# Patient Record
Sex: Female | Born: 1935 | Race: White | Hispanic: No | Smoking: Never smoker
Health system: Southern US, Community
[De-identification: ages and names within clinical notes are randomized; demographics above are authoritative.]

## PROBLEM LIST (undated history)

## (undated) DIAGNOSIS — K219 Gastro-esophageal reflux disease without esophagitis: Secondary | ICD-10-CM

## (undated) DIAGNOSIS — E079 Disorder of thyroid, unspecified: Secondary | ICD-10-CM

## (undated) DIAGNOSIS — E039 Hypothyroidism, unspecified: Secondary | ICD-10-CM

## (undated) DIAGNOSIS — M5136 Other intervertebral disc degeneration, lumbar region: Secondary | ICD-10-CM

## (undated) DIAGNOSIS — K5909 Other constipation: Secondary | ICD-10-CM

## (undated) DIAGNOSIS — I10 Essential (primary) hypertension: Secondary | ICD-10-CM

## (undated) DIAGNOSIS — E7800 Pure hypercholesterolemia, unspecified: Secondary | ICD-10-CM

## (undated) DIAGNOSIS — M85862 Other specified disorders of bone density and structure, left lower leg: Secondary | ICD-10-CM

## (undated) DIAGNOSIS — I700 Atherosclerosis of aorta: Secondary | ICD-10-CM

## (undated) DIAGNOSIS — R7302 Impaired glucose tolerance (oral): Secondary | ICD-10-CM

## (undated) HISTORY — DX: Other intervertebral disc degeneration, lumbar region: M51.36

## (undated) HISTORY — DX: Hypothyroidism, unspecified: E03.9

## (undated) HISTORY — DX: Impaired glucose tolerance (oral): R73.02

## (undated) HISTORY — DX: Pure hypercholesterolemia, unspecified: E78.00

## (undated) HISTORY — DX: Other specified disorders of bone density and structure, left lower leg: M85.862

## (undated) HISTORY — DX: Other constipation: K59.09

## (undated) HISTORY — DX: Gastro-esophageal reflux disease without esophagitis: K21.9

## (undated) HISTORY — DX: Atherosclerosis of aorta: I70.0

## (undated) HISTORY — PX: ABDOMINAL HYSTERECTOMY: SHX81

---

## 2007-05-23 ENCOUNTER — Ambulatory Visit: Payer: Self-pay | Admitting: Ophthalmology

## 2007-05-23 ENCOUNTER — Other Ambulatory Visit: Payer: Self-pay

## 2007-06-02 ENCOUNTER — Ambulatory Visit: Payer: Self-pay | Admitting: Ophthalmology

## 2007-07-14 ENCOUNTER — Ambulatory Visit: Payer: Self-pay | Admitting: Ophthalmology

## 2007-08-12 ENCOUNTER — Ambulatory Visit: Payer: Self-pay | Admitting: Ophthalmology

## 2013-04-15 ENCOUNTER — Emergency Department: Payer: Self-pay | Admitting: Emergency Medicine

## 2013-04-15 LAB — COMPREHENSIVE METABOLIC PANEL
BUN: 18 mg/dL (ref 7–18)
Calcium, Total: 9.8 mg/dL (ref 8.5–10.1)
Co2: 29 mmol/L (ref 21–32)
EGFR (African American): >60
EGFR (Non-African Amer.): 55 — ABNORMAL LOW
Glucose: 127 mg/dL — ABNORMAL HIGH (ref 65–99)
Osmolality: 281 (ref 275–301)
Potassium: 3.9 mmol/L (ref 3.5–5.1)
Sodium: 139 mmol/L (ref 136–145)
Total Protein: 7.9 g/dL (ref 6.4–8.2)

## 2013-04-15 LAB — URINALYSIS, COMPLETE
Bilirubin,UR: NEGATIVE
Nitrite: NEGATIVE
Ph: 7 (ref 4.5–8.0)
Protein: NEGATIVE
Specific Gravity: 1.013 (ref 1.003–1.030)
Squamous Epithelial: <1

## 2013-04-15 LAB — CBC
HGB: 12.8 g/dL (ref 12.0–16.0)
MCH: 31.2 pg (ref 26.0–34.0)
MCV: 92 fL (ref 80–100)
Platelet: 233 10*3/uL (ref 150–440)
RBC: 4.1 10*6/uL (ref 3.80–5.20)
RDW: 13.7 % (ref 11.5–14.5)

## 2013-04-15 LAB — CK TOTAL AND CKMB (NOT AT ARMC)
CK, Total: 70 U/L (ref 21–215)
CK-MB: 0.6 ng/mL (ref 0.5–3.6)

## 2013-04-15 LAB — LIPASE, BLOOD: Lipase: 431 U/L — ABNORMAL HIGH (ref 73–393)

## 2013-04-15 LAB — TROPONIN I: Troponin-I: <0.02 ng/mL

## 2013-04-17 LAB — URINE CULTURE

## 2013-06-30 ENCOUNTER — Ambulatory Visit: Payer: Self-pay | Admitting: Gastroenterology

## 2014-08-02 DIAGNOSIS — E039 Hypothyroidism, unspecified: Secondary | ICD-10-CM

## 2014-08-02 DIAGNOSIS — I10 Essential (primary) hypertension: Secondary | ICD-10-CM | POA: Insufficient documentation

## 2014-08-02 HISTORY — DX: Hypothyroidism, unspecified: E03.9

## 2015-02-03 DIAGNOSIS — E7800 Pure hypercholesterolemia, unspecified: Secondary | ICD-10-CM

## 2015-02-03 HISTORY — DX: Pure hypercholesterolemia, unspecified: E78.00

## 2015-03-07 ENCOUNTER — Other Ambulatory Visit: Payer: Self-pay | Admitting: Gastroenterology

## 2015-03-07 DIAGNOSIS — R109 Unspecified abdominal pain: Principal | ICD-10-CM

## 2015-03-07 DIAGNOSIS — G8929 Other chronic pain: Secondary | ICD-10-CM

## 2015-03-16 ENCOUNTER — Ambulatory Visit
Admission: RE | Admit: 2015-03-16 | Discharge: 2015-03-16 | Disposition: A | Discharge status: Home / Self Care | Payer: Medicare PPO | Source: Ambulatory Visit | Attending: Gastroenterology | Admitting: Gastroenterology

## 2015-03-16 DIAGNOSIS — R109 Unspecified abdominal pain: Secondary | ICD-10-CM | POA: Diagnosis not present

## 2015-03-16 DIAGNOSIS — G8929 Other chronic pain: Secondary | ICD-10-CM | POA: Insufficient documentation

## 2015-03-16 HISTORY — DX: Essential (primary) hypertension: I10

## 2015-03-16 MED ORDER — IOHEXOL 300 MG/ML  SOLN
100.0000 mL | Freq: Once | INTRAMUSCULAR | Status: AC | PRN
  Administered 2015-03-16: 100 mL via INTRAVENOUS

## 2015-06-23 ENCOUNTER — Other Ambulatory Visit: Payer: Self-pay | Admitting: Internal Medicine

## 2015-06-23 DIAGNOSIS — R1084 Generalized abdominal pain: Secondary | ICD-10-CM

## 2015-06-23 DIAGNOSIS — D72829 Elevated white blood cell count, unspecified: Secondary | ICD-10-CM

## 2015-06-24 ENCOUNTER — Ambulatory Visit
Admission: RE | Admit: 2015-06-24 | Discharge: 2015-06-24 | Disposition: A | Discharge status: Home / Self Care | Payer: Medicare PPO | Source: Ambulatory Visit | Attending: Internal Medicine | Admitting: Internal Medicine

## 2015-06-24 ENCOUNTER — Other Ambulatory Visit
Admission: RE | Admit: 2015-06-24 | Discharge: 2015-06-24 | Disposition: A | Discharge status: Home / Self Care | Payer: Medicare PPO | Source: Ambulatory Visit | Attending: Internal Medicine | Admitting: Internal Medicine

## 2015-06-24 DIAGNOSIS — R1084 Generalized abdominal pain: Secondary | ICD-10-CM | POA: Diagnosis present

## 2015-06-24 DIAGNOSIS — M5136 Other intervertebral disc degeneration, lumbar region: Secondary | ICD-10-CM | POA: Insufficient documentation

## 2015-06-24 DIAGNOSIS — Z01812 Encounter for preprocedural laboratory examination: Secondary | ICD-10-CM | POA: Diagnosis present

## 2015-06-24 DIAGNOSIS — I700 Atherosclerosis of aorta: Secondary | ICD-10-CM | POA: Diagnosis not present

## 2015-06-24 DIAGNOSIS — K859 Acute pancreatitis, unspecified: Secondary | ICD-10-CM | POA: Diagnosis not present

## 2015-06-24 DIAGNOSIS — D72829 Elevated white blood cell count, unspecified: Secondary | ICD-10-CM

## 2015-06-24 DIAGNOSIS — K5730 Diverticulosis of large intestine without perforation or abscess without bleeding: Secondary | ICD-10-CM | POA: Diagnosis not present

## 2015-06-24 LAB — CREATININE, SERUM
Creatinine, Ser: 0.83 mg/dL (ref 0.44–1.00)
GFR calc non Af Amer: >60 mL/min (ref >60)

## 2015-06-24 MED ORDER — IOHEXOL 300 MG/ML  SOLN
100.0000 mL | Freq: Once | INTRAMUSCULAR | Status: AC | PRN
  Administered 2015-06-24: 100 mL via INTRAVENOUS

## 2015-06-27 ENCOUNTER — Other Ambulatory Visit: Payer: Self-pay | Admitting: Internal Medicine

## 2015-06-27 DIAGNOSIS — R1084 Generalized abdominal pain: Secondary | ICD-10-CM

## 2015-08-08 DIAGNOSIS — Z8719 Personal history of other diseases of the digestive system: Secondary | ICD-10-CM | POA: Insufficient documentation

## 2015-08-09 ENCOUNTER — Emergency Department
Admission: EM | Admit: 2015-08-09 | Discharge: 2015-08-09 | Disposition: A | Discharge status: Home / Self Care | Payer: Medicare PPO | Attending: Emergency Medicine | Admitting: Emergency Medicine

## 2015-08-09 ENCOUNTER — Emergency Department: Payer: Medicare PPO

## 2015-08-09 ENCOUNTER — Encounter: Payer: Self-pay | Admitting: *Deleted

## 2015-08-09 DIAGNOSIS — S0990XA Unspecified injury of head, initial encounter: Secondary | ICD-10-CM | POA: Diagnosis not present

## 2015-08-09 DIAGNOSIS — Y9289 Other specified places as the place of occurrence of the external cause: Secondary | ICD-10-CM | POA: Insufficient documentation

## 2015-08-09 DIAGNOSIS — S0081XA Abrasion of other part of head, initial encounter: Secondary | ICD-10-CM | POA: Diagnosis not present

## 2015-08-09 DIAGNOSIS — S92301A Fracture of unspecified metatarsal bone(s), right foot, initial encounter for closed fracture: Secondary | ICD-10-CM

## 2015-08-09 DIAGNOSIS — W01198A Fall on same level from slipping, tripping and stumbling with subsequent striking against other object, initial encounter: Secondary | ICD-10-CM | POA: Diagnosis not present

## 2015-08-09 DIAGNOSIS — W19XXXA Unspecified fall, initial encounter: Secondary | ICD-10-CM

## 2015-08-09 DIAGNOSIS — S92341A Displaced fracture of fourth metatarsal bone, right foot, initial encounter for closed fracture: Secondary | ICD-10-CM | POA: Insufficient documentation

## 2015-08-09 DIAGNOSIS — Y998 Other external cause status: Secondary | ICD-10-CM | POA: Diagnosis not present

## 2015-08-09 DIAGNOSIS — S92351A Displaced fracture of fifth metatarsal bone, right foot, initial encounter for closed fracture: Secondary | ICD-10-CM | POA: Insufficient documentation

## 2015-08-09 DIAGNOSIS — I10 Essential (primary) hypertension: Secondary | ICD-10-CM | POA: Insufficient documentation

## 2015-08-09 DIAGNOSIS — Y9389 Activity, other specified: Secondary | ICD-10-CM | POA: Insufficient documentation

## 2015-08-09 DIAGNOSIS — S0083XA Contusion of other part of head, initial encounter: Secondary | ICD-10-CM | POA: Insufficient documentation

## 2015-08-09 DIAGNOSIS — S99911A Unspecified injury of right ankle, initial encounter: Secondary | ICD-10-CM | POA: Diagnosis present

## 2015-08-09 HISTORY — DX: Disorder of thyroid, unspecified: E07.9

## 2015-08-09 MED ORDER — TRAMADOL HCL 50 MG PO TABS: 50.0000 mg | ORAL_TABLET | Freq: Four times a day (QID) | ORAL | Status: AC | PRN

## 2015-08-09 NOTE — ED Provider Notes (Signed)
[facility] Emergency Department Provider Note     Time seen: ----------------------------------------- 12:00 PM on 08/09/2015 -----------------------------------------    I have reviewed the triage vital signs and the nursing notes.   HISTORY  Chief Complaint Fall    HPI Ashley Harmon is a 81 y.o. female that according to EMS reports she stood up from the couch and felt like her right ankle was numb. Patient states right ankle turned and she fell and hit the right side of her head on the couch or the chair. Patient denies any loss of consciousness, c-collar was placed for precautions. She denies any neck or back pain, main complaints are the right eyebrow injury and right ankle swelling. Right ankle pain is worse with range of motion.   Past Medical History  Diagnosis Date  . Hypertension   . Thyroid disease     There are no active problems to display for this patient.   Past Surgical History  Procedure Laterality Date  . Abdominal hysterectomy      Allergies Review of patient's allergies indicates no known allergies.  Social History Social History  Substance Use Topics  . Smoking status: Never Smoker   . Smokeless tobacco: Current User  . Alcohol Use: No    Review of Systems Constitutional: Negative for fever. Eyes: Negative for visual changes. ENT: Negative for sore throat. Positive for right eyebrow contusion and abrasion Cardiovascular: Negative for chest pain. Respiratory: Negative for shortness of breath. Gastrointestinal: Negative for abdominal pain, vomiting and diarrhea. Genitourinary: Negative for dysuria. Musculoskeletal: Negative for back pain. Positive for right ankle pain Skin: Negative for rash. Neurological: Negative for headaches, focal weakness or numbness.  10-point ROS otherwise negative.  ____________________________________________   PHYSICAL EXAM:  VITAL SIGNS: ED Triage Vitals  Enc Vitals Group      BP 08/09/15 1119 160/72 mmHg     Pulse Rate 08/09/15 1119 88     Resp 08/09/15 1119 18     Temp 08/09/15 1119 98.4 F (36.9 C)     Temp Source 08/09/15 1119 Oral     SpO2 08/09/15 1119 100 %     Weight 08/09/15 1119 140 lb (63.504 kg)     Height 08/09/15 1119 5\' 8"  (1.727 m)     Head Cir --      Peak Flow --      Pain Score 08/09/15 1120 2     Pain Loc --      Pain Edu? --      Excl. in GC? --     Constitutional: Alert and oriented. Well appearing and in no distress. Eyes: Conjunctivae are normal. PERRL. Normal extraocular movements. ENT   Head: There is ecchymosis and abrasion in and around the right eyebrow   Nose: No congestion/rhinnorhea.   Mouth/Throat: Mucous membranes are moist.   Neck: No stridor. Cardiovascular: Normal rate, regular rhythm. Normal and symmetric distal pulses are present in all extremities. No murmurs, rubs, or gallops. Respiratory: Normal respiratory effort without tachypnea nor retractions. Breath sounds are clear and equal bilaterally. No wheezes/rales/rhonchi. Gastrointestinal: Soft and nontender. No distention. No abdominal bruits.  Musculoskeletal: Marked swelling and tenderness is noted to the right ankle laterally with ecchymosis and swelling. Neurologic:  Normal speech and language. No gross focal neurologic deficits are appreciated. Speech is normal. No gait instability. Skin:  Skin is warm, dry with abrasion noted to the right eyebrow Psychiatric: Mood and affect are normal. Speech and behavior are normal. Patient exhibits appropriate insight  and judgment. ____________________________________________  ED COURSE:  Pertinent labs & imaging results that were available during my care of the patient were reviewed by me and considered in my medical decision making (see chart for details). Patient is no acute distress, will obtain CT imaging and ankle x-rays.  RADIOLOGY Images were viewed by me  CT head, right ankle  x-rays IMPRESSION: No acute intracranial process.  Chronic small vessel ischemic changes.  IMPRESSION: No ankle fracture. Soft tissue swelling adjacent to lateral malleolus. Mild displaced oblique fracture of fourth and fifth metatarsal partially visualized. ____________________________________________  FINAL ASSESSMENT AND PLAN  Fall, minor head injury, metatarsal fracture  Plan: Patient with labs and imaging as dictated above. Patient with metatarsal fracture seen on plain film. She'll be placed in a cast shoe and encouraged to have close follow-up with orthopedics for reevaluation.   [name], MD   [name], MD 08/09/15 [phone]

## 2015-08-09 NOTE — ED Notes (Signed)
Per EMS report, patient states she stood up from the couch and felt her right ankle was numb. Patient states her right ankle turned and she fell and hit the right side of her head on the couch or the chair. Patient denies LOC. C-collar is in place for precaution. Patient denies neck or back pain. Patient has swelling and abrasion above right eye. Right ankle is swollen.

## 2015-08-09 NOTE — Discharge Instructions (Signed)
Head Injury, Adult You have a head injury. Headaches and throwing up (vomiting) are common after a head injury. It should be easy to wake up from sleeping. Sometimes you must stay in the hospital. Most problems happen within the first 24 hours. Side effects may occur up to 7-10 days after the injury.  WHAT ARE THE TYPES OF HEAD INJURIES? Head injuries can be as minor as a bump. Some head injuries can be more severe. More severe head injuries include:  A jarring injury to the brain (concussion).  A bruise of the brain (contusion). This mean there is bleeding in the brain that can cause swelling.  A cracked skull (skull fracture).  Bleeding in the brain that collects, clots, and forms a bump (hematoma). WHEN SHOULD I GET HELP RIGHT AWAY?   You are confused or sleepy.  You cannot be woken up.  You feel sick to your stomach (nauseous) or keep throwing up (vomiting).  Your dizziness or unsteadiness is getting worse.  You have very bad, lasting headaches that are not helped by medicine. Take medicines only as told by your doctor.  You cannot use your arms or legs like normal.  You cannot walk.  You notice changes in the black spots in the center of the colored part of your eye (pupil).  You have clear or bloody fluid coming from your nose or ears.  You have trouble seeing. During the next 24 hours after the injury, you must stay with someone who can watch you. This person should get help right away (call 911 in the [place] if you start to shake and are not able to control it (have seizures), you pass out, or you are unable to wake up. HOW CAN I PREVENT A HEAD INJURY IN THE FUTURE?  Wear seat belts.  Wear a helmet while bike riding and playing sports like football.  Stay away from dangerous activities around the house. WHEN CAN I RETURN TO NORMAL ACTIVITIES AND ATHLETICS? See your doctor before doing these activities. You should not do normal activities or play contact sports until 1  week after the following symptoms have stopped:  Headache that does not go away.  Dizziness.  Poor attention.  Confusion.  Memory problems.  Sickness to your stomach or throwing up.  Tiredness.  Fussiness.  Bothered by bright lights or loud noises.  Anxiousness or depression.  Restless sleep. MAKE SURE YOU:   Understand these instructions.  Will watch your condition.  Will get help right away if you are not doing well or get worse.   This information is not intended to replace advice given to you by your health care provider. Make sure you discuss any questions you have with your health care provider.   Document Released: 09/13/2008 Document Revised: 10/22/2014 Document Reviewed: 06/08/2013 Elsevier Interactive Patient Education 2016 Else[employer] Inc[employer]  Metatarsal Fracture A metatarsal fracture is a break in a metatarsal bone. Metatarsal bones connect your toe bones to your ankle bones. CAUSES This type of fracture may be caused by:  A sudden twisting of your foot.  A fall onto your foot.  Overuse or repetitive exercise. RISK FACTORS This condition is more likely to develop in people who:  Play contact sports.  Have a bone disease.  Have a low calcium level. SYMPTOMS Symptoms of this condition include:  Pain that is worse when walking or standing.  Pain when pressing on the foot or moving the toes.  Swelling.  Bruising on the top or bottom of  the foot.  A foot that appears shorter than the other one. DIAGNOSIS This condition is diagnosed with a physical exam. You may also have imaging tests, such as:  X-rays.  A CT scan.  MRI. TREATMENT Treatment for this condition depends on its severity and whether a bone has moved out of place. Treatment may involve:  Rest.  Wearing foot support such as a cast, splint, or boot for several weeks.  Using crutches.  Surgery to move bones back into the right position. Surgery is usually needed if there are  many pieces of broken bone or bones that are very out of place (displaced fracture).  Physical therapy. This may be needed to help you regain full movement and strength in your foot. You will need to return to your health care provider to have X-rays taken until your bones heal. Your health care provider will look at the X-rays to make sure that your foot is healing well. HOME CARE INSTRUCTIONS  If You Have a Cast:  Do not stick anything inside the cast to scratch your skin. Doing that increases your risk of infection.  Check the skin around the cast every day. Report any concerns to your health care provider. You may put lotion on dry skin around the edges of the cast. Do not apply lotion to the skin underneath the cast.  Keep the cast clean and dry. If You Have a Splint or a Supportive Boot:  Wear it as directed by your health care provider. Remove it only as directed by your health care provider.  Loosen it if your toes become numb and tingle, or if they turn cold and blue.  Keep it clean and dry. Bathing  Do not take baths, swim, or use a hot tub until your health care provider approves. Ask your health care provider if you can take showers. You may only be allowed to take sponge baths for bathing.  If your health care provider approves bathing and showering, cover the cast or splint with a watertight plastic bag to protect it from water. Do not let the cast or splint get wet. Managing Pain, Stiffness, and Swelling  If directed, apply ice to the injured area (if you have a splint, not a cast).  Put ice in a plastic bag.  Place a towel between your skin and the bag.  Leave the ice on for 20 minutes, 2-3 times per day.  Move your toes often to avoid stiffness and to lessen swelling.  Raise (elevate) the injured area above the level of your heart while you are sitting or lying down. Driving  Do not drive or operate heavy machinery while taking pain medicine.  Do not drive  while wearing foot support on a foot that you use for driving. Activity  Return to your normal activities as directed by your health care provider. Ask your health care provider what activities are safe for you.  Perform exercises as directed by your health care provider or physical therapist. Safety  Do not use the injured foot to support your body weight until your health care provider says that you can. Use crutches as directed by your health care provider. General Instructions  Do not put pressure on any part of the cast or splint until it is fully hardened. This may take several hours.  Do not use any tobacco products, including cigarettes, chewing tobacco, or e-cigarettes. Tobacco can delay bone healing. If you need help quitting, ask your health care provider.  Take  medicines only as directed by your health care provider.  Keep all follow-up visits as directed by your health care provider. This is important. SEEK MEDICAL CARE IF:  You have a fever.  Your cast, splint, or boot is too loose or too tight.  Your cast, splint, or boot is damaged.  Your pain medicine is not helping.  You have pain, tingling, or numbness in your foot that is not going away. SEEK IMMEDIATE MEDICAL CARE IF:  You have severe pain.  You have tingling or numbness in your foot that is getting worse.  Your foot feels cold or becomes numb.  Your foot changes color.   This information is not intended to replace advice given to you by your health care provider. Make sure you discuss any questions you have with your health care provider.   Document Released: 06/23/2002 Document Revised: 02/15/2015 Document Reviewed: 07/28/2014 Elsevier Interactive Patient Education [employer]2016 Elsevier Inc.

## 2016-02-07 DIAGNOSIS — M85862 Other specified disorders of bone density and structure, left lower leg: Secondary | ICD-10-CM

## 2016-02-07 HISTORY — DX: Other specified disorders of bone density and structure, left lower leg: M85.862

## 2016-12-03 ENCOUNTER — Other Ambulatory Visit: Payer: Self-pay | Admitting: Family Medicine

## 2016-12-03 DIAGNOSIS — R140 Abdominal distension (gaseous): Secondary | ICD-10-CM

## 2016-12-03 DIAGNOSIS — R1031 Right lower quadrant pain: Secondary | ICD-10-CM

## 2016-12-03 DIAGNOSIS — R1032 Left lower quadrant pain: Secondary | ICD-10-CM

## 2016-12-06 ENCOUNTER — Ambulatory Visit
Admission: RE | Admit: 2016-12-06 | Discharge: 2016-12-06 | Disposition: A | Discharge status: Home / Self Care | Payer: Medicare PPO | Source: Ambulatory Visit | Attending: Family Medicine | Admitting: Family Medicine

## 2016-12-12 ENCOUNTER — Ambulatory Visit
Admission: RE | Admit: 2016-12-12 | Discharge: 2016-12-12 | Disposition: A | Discharge status: Home / Self Care | Payer: Medicare PPO | Source: Ambulatory Visit | Attending: Family Medicine | Admitting: Family Medicine

## 2017-01-02 ENCOUNTER — Ambulatory Visit: Admission: RE | Admit: 2017-01-02 | Payer: Medicare PPO | Source: Ambulatory Visit

## 2017-01-08 ENCOUNTER — Ambulatory Visit
Admission: RE | Admit: 2017-01-08 | Discharge: 2017-01-08 | Disposition: A | Discharge status: Home / Self Care | Payer: Medicare PPO | Source: Ambulatory Visit | Attending: Family Medicine | Admitting: Family Medicine

## 2017-01-08 DIAGNOSIS — R1032 Left lower quadrant pain: Secondary | ICD-10-CM | POA: Insufficient documentation

## 2017-01-08 DIAGNOSIS — R1031 Right lower quadrant pain: Secondary | ICD-10-CM | POA: Diagnosis not present

## 2017-01-08 DIAGNOSIS — M4317 Spondylolisthesis, lumbosacral region: Secondary | ICD-10-CM | POA: Diagnosis not present

## 2017-01-08 DIAGNOSIS — Z90710 Acquired absence of both cervix and uterus: Secondary | ICD-10-CM | POA: Diagnosis not present

## 2017-01-08 DIAGNOSIS — R140 Abdominal distension (gaseous): Secondary | ICD-10-CM | POA: Diagnosis present

## 2017-01-08 DIAGNOSIS — K5750 Diverticulosis of both small and large intestine without perforation or abscess without bleeding: Secondary | ICD-10-CM | POA: Diagnosis not present

## 2017-01-08 MED ORDER — IOPAMIDOL (ISOVUE-300) INJECTION 61%
100.0000 mL | Freq: Once | INTRAVENOUS | Status: AC | PRN
  Administered 2017-01-08: 100 mL via INTRAVENOUS

## 2017-02-07 DIAGNOSIS — Z79899 Other long term (current) drug therapy: Secondary | ICD-10-CM | POA: Insufficient documentation

## 2017-02-07 DIAGNOSIS — K5909 Other constipation: Secondary | ICD-10-CM

## 2017-02-07 DIAGNOSIS — M5136 Other intervertebral disc degeneration, lumbar region: Secondary | ICD-10-CM

## 2017-02-07 DIAGNOSIS — I700 Atherosclerosis of aorta: Secondary | ICD-10-CM

## 2017-02-07 DIAGNOSIS — R7302 Impaired glucose tolerance (oral): Secondary | ICD-10-CM | POA: Insufficient documentation

## 2017-02-07 HISTORY — DX: Atherosclerosis of aorta: I70.0

## 2017-02-07 HISTORY — DX: Other intervertebral disc degeneration, lumbar region: M51.36

## 2017-02-07 HISTORY — DX: Other constipation: K59.09

## 2017-02-07 HISTORY — DX: Impaired glucose tolerance (oral): R73.02

## 2017-02-17 ENCOUNTER — Emergency Department
Admission: EM | Admit: 2017-02-17 | Discharge: 2017-02-17 | Disposition: A | Discharge status: Home / Self Care | Payer: Medicare PPO | Attending: Emergency Medicine | Admitting: Emergency Medicine

## 2017-02-17 ENCOUNTER — Encounter: Payer: Self-pay | Admitting: Emergency Medicine

## 2017-02-17 ENCOUNTER — Emergency Department: Payer: Medicare PPO

## 2017-02-17 DIAGNOSIS — R350 Frequency of micturition: Secondary | ICD-10-CM | POA: Insufficient documentation

## 2017-02-17 DIAGNOSIS — R300 Dysuria: Secondary | ICD-10-CM | POA: Diagnosis present

## 2017-02-17 DIAGNOSIS — R739 Hyperglycemia, unspecified: Secondary | ICD-10-CM | POA: Insufficient documentation

## 2017-02-17 DIAGNOSIS — R1031 Right lower quadrant pain: Secondary | ICD-10-CM | POA: Insufficient documentation

## 2017-02-17 DIAGNOSIS — E871 Hypo-osmolality and hyponatremia: Secondary | ICD-10-CM | POA: Diagnosis not present

## 2017-02-17 DIAGNOSIS — F17200 Nicotine dependence, unspecified, uncomplicated: Secondary | ICD-10-CM | POA: Insufficient documentation

## 2017-02-17 DIAGNOSIS — I10 Essential (primary) hypertension: Secondary | ICD-10-CM | POA: Insufficient documentation

## 2017-02-17 LAB — BASIC METABOLIC PANEL
Anion gap: 10 (ref 5–15)
BUN: 11 mg/dL (ref 6–20)
CHLORIDE: 96 mmol/L — AB (ref 101–111)
CO2: 26 mmol/L (ref 22–32)
Calcium: 9.4 mg/dL (ref 8.9–10.3)
Creatinine, Ser: 0.8 mg/dL (ref 0.44–1.00)
GFR calc non Af Amer: >60 mL/min (ref >60)
Glucose, Bld: 194 mg/dL — ABNORMAL HIGH (ref 65–99)
POTASSIUM: 3.6 mmol/L (ref 3.5–5.1)
Sodium: 132 mmol/L — ABNORMAL LOW (ref 135–145)

## 2017-02-17 LAB — CBC
HCT: 36.2 % (ref 35.0–47.0)
HEMOGLOBIN: 12.6 g/dL (ref 12.0–16.0)
MCH: 32.3 pg (ref 26.0–34.0)
MCHC: 34.8 g/dL (ref 32.0–36.0)
MCV: 92.6 fL (ref 80.0–100.0)
Platelets: 237 10*3/uL (ref 150–440)
RBC: 3.91 MIL/uL (ref 3.80–5.20)
RDW: 14.1 % (ref 11.5–14.5)
WBC: 8.4 10*3/uL (ref 3.6–11.0)

## 2017-02-17 LAB — URINALYSIS, COMPLETE (UACMP) WITH MICROSCOPIC
Bacteria, UA: NONE SEEN
Bilirubin Urine: NEGATIVE
GLUCOSE, UA: NEGATIVE mg/dL
Ketones, ur: 5 mg/dL — AB
NITRITE: NEGATIVE
PROTEIN: 100 mg/dL — AB
Specific Gravity, Urine: 1.013 (ref 1.005–1.030)
Squamous Epithelial / LPF: NONE SEEN
pH: 5 (ref 5.0–8.0)

## 2017-02-17 MED ORDER — PHENAZOPYRIDINE HCL 100 MG PO TABS
95.0000 mg | ORAL_TABLET | Freq: Once | ORAL | Status: AC
  Administered 2017-02-17: 100 mg via ORAL
  Filled 2017-02-17: qty 1

## 2017-02-17 MED ORDER — SODIUM CHLORIDE 0.9 % IV BOLUS (SEPSIS)
1000.0000 mL | Freq: Once | INTRAVENOUS | Status: AC
  Administered 2017-02-17: 1000 mL via INTRAVENOUS

## 2017-02-17 NOTE — ED Triage Notes (Addendum)
Pt states over a week ago [facility] [place] states she was given RX for antibiotic. Continues to c/o urinary frequency and dysuria. Denies blood or back pain.  Pt is A/O x 4

## 2017-02-17 NOTE — Discharge Instructions (Addendum)
Today, you have no bacteria in your urine. Please make an appointment with Dr. Apolinar JunesBrandon, the urologist, to discuss your symptoms.  Please have your primary care physician recheck your sodium and blood sugar levels.  Return to the emergency department if you develop severe pain, fever, lightheadedness or fainting, vomiting, or any other symptoms concerning to you.

## 2017-02-17 NOTE — ED Provider Notes (Signed)
[facility] Emergency Department Provider Note  ____________________________________________  Time seen: Approximately 3:32 PM  I have reviewed the triage vital signs and the nursing notes.   HISTORY  Chief Complaint Urinary Frequency    HPI Ashley Harmon is a 83 y.o. female with a history of HTN presenting with dysuria, urinary frequency and urinary urgency. The patient reports that for over week, she has had burning with urination, feeling like she has to urinate more frequently and then only a few drops come out. She was seen at urgent care 7 days ago and initiated on an antibiotic, and has not noticed any improvement in her symptoms. She did occasionally notice a brief right lower quadrant pain, but denies any flank pain; she has no history of renal colic. No fevers or chills, nausea or vomiting.   Past Medical History:  Diagnosis Date  . Hypertension   . Thyroid disease     There are no active problems to display for this patient.   Past Surgical History:  Procedure Laterality Date  . ABDOMINAL HYSTERECTOMY        Allergies Patient has no known allergies.  History reviewed. No pertinent family history.  Social History Social History  Substance Use Topics  . Smoking status: Never Smoker  . Smokeless tobacco: Current User  . Alcohol use No    Review of Systems Constitutional: No fever/chills.No lightheadedness or syncope. Eyes: No visual changes. ENT: No sore throat. No congestion or rhinorrhea. Cardiovascular: Denies chest pain. Denies palpitations. Respiratory: Denies shortness of breath.  No cough. Gastrointestinal: Positive intermittent right lower quadrant abdominal pain.  No nausea, no vomiting.  No diarrhea.  No constipation. Genitourinary: Positive for dysuria. Positive for urinary frequency. Positive for urinary urgency. Positive for decreased urine output. Musculoskeletal: Negative for back pain. Skin: Negative for  rash. Neurological: Negative for headaches. No focal numbness, tingling or weakness.   10-point ROS otherwise negative.  ____________________________________________   PHYSICAL EXAM:  VITAL SIGNS: ED Triage Vitals  Enc Vitals Group     BP 02/17/17 1446 137/62     Pulse Rate 02/17/17 1446 70     Resp 02/17/17 1446 18     Temp 02/17/17 1446 98.9 F (37.2 C)     Temp Source 02/17/17 1446 Oral     SpO2 02/17/17 1446 97 %     Weight 02/17/17 1447 160 lb (72.6 kg)     Height 02/17/17 1447 5\' 7"  (1.702 m)     Head Circumference --      Peak Flow --      Pain Score 02/17/17 1454 10     Pain Loc --      Pain Edu? --      Excl. in GC? --     Constitutional: Alert and oriented. Well appearing and in no acute distress. Answers questions appropriately. Eyes: Conjunctivae are normal.  EOMI. No scleral icterus. Head: Atraumatic. Nose: No congestion/rhinnorhea. Mouth/Throat: Mucous membranes are moist.  Neck: No stridor.  Supple.  No meningismus. Cardiovascular: Normal rate, regular rhythm. No murmurs, rubs or gallops.  Respiratory: Normal respiratory effort.  No accessory muscle use or retractions. Lungs CTAB.  No wheezes, rales or ronchi. Gastrointestinal: Soft, nontender and mildly distended. No CVA tenderness to palpation..  No guarding or rebound.  No peritoneal signs. Musculoskeletal: Positive bilateral symmetric lower extremity edema. Neurologic:  A&Ox3.  Speech is clear.  Face and smile are symmetric.  EOMI.  Moves all extremities well. Skin:  Skin is warm,  dry and intact. No rash noted. Psychiatric: Mood and affect are normal. Speech and behavior are normal.  Normal judgement.  ____________________________________________   LABS (all labs ordered are listed, but only abnormal results are displayed)  Labs Reviewed  URINALYSIS, COMPLETE (UACMP) WITH MICROSCOPIC - Abnormal; Notable for the following:       Result Value   Color, Urine YELLOW (*)    APPearance CLOUDY (*)     Hgb urine dipstick SMALL (*)    Ketones, ur 5 (*)    Protein, ur 100 (*)    Leukocytes, UA LARGE (*)    All other components within normal limits  BASIC METABOLIC PANEL - Abnormal; Notable for the following:    Sodium 132 (*)    Chloride 96 (*)    Glucose, Bld 194 (*)    All other components within normal limits  CBC   ____________________________________________  EKG  Not indicated ____________________________________________  RADIOLOGY  Ct Renal Stone Study  Result Date: 02/17/2017 CLINICAL DATA:  Dysuria. Recent urinary tract infection. No improvement in symptoms following therapy. EXAM: CT ABDOMEN AND PELVIS WITHOUT CONTRAST TECHNIQUE: Multidetector CT imaging of the abdomen and pelvis was performed following the standard protocol without IV contrast. COMPARISON:  CT 04/15/2013, 01/08/2017 and 06/24/2015. FINDINGS: Lower chest: Stable subpleural reticulation at both lung bases, likely chronic. No significant pleural or pericardial effusion. Hepatobiliary: As visualized in the unenhanced state, the liver appears stable without suspicious findings. No evidence of gallstones, gallbladder wall thickening or biliary dilatation. Pancreas: Unremarkable. No pancreatic ductal dilatation or surrounding inflammatory changes. Spleen: Normal in size without focal abnormality. Adrenals/Urinary Tract: Both adrenal glands appear normal. There is no evidence of urinary tract calculus or hydronephrosis. Renovascular calcifications are present bilaterally. There is stable mild symmetric perinephric soft tissue stranding bilaterally. The bladder is nearly empty and suboptimally evaluated. Suspected mild pelvic floor laxity. Stomach/Bowel: No evidence of bowel wall thickening, distention or surrounding inflammatory change. There are diverticular changes throughout the sigmoid colon. The appendix appears normal. There is a large proximal duodenal diverticulum. Vascular/Lymphatic: There are no enlarged abdominal  or pelvic lymph nodes. Diffuse aortic and branch vessel atherosclerosis. Reproductive: Hysterectomy. No evidence of adnexal mass. As above, probable pelvic floor laxity. Other: No evidence of abdominal wall mass or hernia. No ascites. Musculoskeletal: No acute osseous findings. There are degenerative changes throughout the thoracolumbar spine associated with a convex left scoliosis. There are large central calcified disc protrusions at T10-11 and T11-12 which narrow the AP diameter of the canal and may contribute to mass effect on the distal thoracic cord. These appear unchanged. There are chronic bilateral L5 pars defects with a resulting grade 1 anterolisthesis and moderate foraminal narrowing bilaterally at L5-S1. IMPRESSION: 1. No evidence of urinary tract calculus or hydronephrosis. No complicated urinary tract infection identified. 2. Moderate diffuse atherosclerosis. 3. Suspected pelvic floor laxity post hysterectomy. 4. Chronic calcified central disc protrusions at T10-11 and T11-12 and chronic bilateral L5 pars defects. These findings may be symptomatic. 5. No acute findings seen. Electronically Signed   By: Carey Bullocks M.D.   On: 02/17/2017 15:58    ____________________________________________   PROCEDURES  Procedure(s) performed: None  Procedures  Critical Care performed: No ____________________________________________   INITIAL IMPRESSION / ASSESSMENT AND PLAN / ED COURSE  Pertinent labs & imaging results that were available during my care of the patient were reviewed by me and considered in my medical decision making (see chart for details).  83 y.o. female presenting with dysuria associated with  urinary frequency and decreased output that did not improve with a course of outpatient antibiotics. Reviewing the patient's chart, it appears she was seen at urgent care, then followed up by her primary care physician for ongoing symptoms despite the treatment with Macrobid,  Phenazopyrid; the patient has a negative urine culture according to the notes from these visits.  Today, the patient has no bacteria in her urinalysis but does have white blood cells, and red blood cells as well as leukocyte esterase. Renal colic is possible, so we will get a CT scan for further evaluation. Her creatinine today is reassuring at 0.80. She is mildly hypo-natremic and hyperglycemic, which I will treat with a liter fluid and have her follow-up with her primary care physician.  Plan reevaluation for final disposition.  ----------------------------------------- 5:04 PM on 02/17/2017 -----------------------------------------  The patient does not have any bacteria in her urine, is negative for nitrites, has a normal creatinine, and has a CT scan does not show any abnormalities in the GU tract. However, given that she does have white blood cells and red blood cells in her urine, and that she is symptomatic, I will plan to discharge her with a follow-up appointment with urology. At this time, the patient is stable for discharge. Return precautions as well as follow-up instructions were discussed.  ____________________________________________  FINAL CLINICAL IMPRESSION(S) / ED DIAGNOSES  Final diagnoses:  Hyponatremia  Hyperglycemia  Dysuria  Urinary frequency         NEW MEDICATIONS STARTED DURING THIS VISIT:  New Prescriptions   No medications on file      Rockne MenghiniNorman, Anne-Caroline, MD 02/17/17 1705

## 2017-04-01 ENCOUNTER — Ambulatory Visit (INDEPENDENT_AMBULATORY_CARE_PROVIDER_SITE_OTHER): Payer: Medicare PPO | Admitting: Urology

## 2017-04-01 ENCOUNTER — Encounter: Payer: Self-pay | Admitting: Urology

## 2017-04-01 VITALS — BP 93/58 | HR 120 | Ht 67.0 in | Wt 160.0 lb

## 2017-04-01 DIAGNOSIS — R300 Dysuria: Secondary | ICD-10-CM | POA: Diagnosis not present

## 2017-04-01 DIAGNOSIS — N3020 Other chronic cystitis without hematuria: Secondary | ICD-10-CM | POA: Diagnosis not present

## 2017-04-01 LAB — URINALYSIS, COMPLETE
Bilirubin, UA: NEGATIVE
GLUCOSE, UA: NEGATIVE
Ketones, UA: NEGATIVE
Nitrite, UA: NEGATIVE
Specific Gravity, UA: 1.015 (ref 1.005–1.030)
Urobilinogen, Ur: 0.2 mg/dL (ref 0.2–1.0)
pH, UA: 5.5 (ref 5.0–7.5)

## 2017-04-01 LAB — MICROSCOPIC EXAMINATION: RBC, UA: NONE SEEN /hpf (ref 0–?)

## 2017-04-01 MED ORDER — NITROFURANTOIN MONOHYD MACRO 100 MG PO CAPS: 100.0000 mg | ORAL_CAPSULE | Freq: Two times a day (BID) | ORAL | 0 refills | Status: DC

## 2017-04-01 NOTE — Progress Notes (Signed)
04/01/2017 1:20 PM   Ashley Harmon 1931-02-10 [id number]  Referring provider: Mickey Farber, MD [address] [facility] [name], [place] [address]  Chief Complaint  Patient presents with  . Dysuria    New patient    HPI: The patient recently went to the emergency room with urgency and burning not responsive to an antibiotic given prior at urgent care. The patient had a normal CT scan without contrast. No urine culture was done on the May 6 visit  The patient for the last several months has abdominal pain. It is worse when she voids. It is not daily. Sometimes she takes Tylenol. She described to her 3 negative cultures.  At baseline she voids every 2 or 3 hours and sometimes more frequently. She gets up 2 or 3 times a night. She rarely leaks and does not work pad.  She has constipation and has had a hysterectomy. She has no neurologic issues  She denies a history of kidney stones and previous GU surgery   PMH: Past Medical History:  Diagnosis Date  . Atherosclerosis of abdominal aorta (HCC) 02/07/2017   Overview:  On 06/24/2015 and 01/08/2017 abdominal CTs  . Chronic constipation 02/07/2017  . GERD (gastroesophageal reflux disease)   . Hypercholesterolemia 02/03/2015  . Hypertension   . Hypothyroidism, unspecified 08/02/2014  . Impaired glucose tolerance 02/07/2017  . Lumbar degenerative disc disease 02/07/2017   Overview:  On 06/24/2015 and 01/08/2017 abdominal CTs  . Osteopenia of left lower leg 02/07/2016  . Thyroid disease     Surgical History: Past Surgical History:  Procedure Laterality Date  . ABDOMINAL HYSTERECTOMY      Home Medications:  Allergies as of 04/01/2017      Reactions   Penicillin V Potassium    Other reaction(s): Unknown      Medication List    as of 04/01/2017  1:20 PM   You have not been prescribed any medications.     Allergies:  Allergies  Allergen Reactions  . Penicillin V Potassium     Other reaction(s): Unknown     Family History: No family history on file.  Social History:  reports that she has never smoked. She uses smokeless tobacco. She reports that she does not drink alcohol. Her drug history is not on file.  ROS:                                        Physical Exam: There were no vitals taken for this visit.  Constitutional:  Alert and oriented, No acute distress. HEENT: [place] AT, moist mucus membranes.  Trachea midline, no masses. Cardiovascular: No clubbing, cyanosis, or edema. Respiratory: Normal respiratory effort, no increased work of breathing. GI: Abdomen is soft, nontender, nondistended, no abdominal masses GU: No prolapse and moderate vaginal atrophy Skin: No rashes, bruises or suspicious lesions. Lymph: No cervical or inguinal adenopathy. Neurologic: Grossly intact, no focal deficits, moving all 4 extremities. Psychiatric: Normal mood and affect.  Laboratory Data: Lab Results  Component Value Date   WBC 8.4 02/17/2017   HGB 12.6 02/17/2017   HCT 36.2 02/17/2017   MCV 92.6 02/17/2017   PLT 237 02/17/2017    Lab Results  Component Value Date   CREATININE 0.80 02/17/2017    No results found for: PSA  No results found for: TESTOSTERONE  No results found for: HGBA1C  Urinalysis    Component  Value Date/Time   COLORURINE YELLOW (A) 02/17/2017 1453   APPEARANCEUR CLOUDY (A) 02/17/2017 1453   APPEARANCEUR Clear 04/15/2013 2120   LABSPEC 1.013 02/17/2017 1453   LABSPEC 1.013 04/15/2013 2120   PHURINE 5.0 02/17/2017 1453   GLUCOSEU NEGATIVE 02/17/2017 1453   GLUCOSEU Negative 04/15/2013 2120   HGBUR SMALL (A) 02/17/2017 1453   BILIRUBINUR NEGATIVE 02/17/2017 1453   BILIRUBINUR Negative 04/15/2013 2120   KETONESUR 5 (A) 02/17/2017 1453   PROTEINUR 100 (A) 02/17/2017 1453   NITRITE NEGATIVE 02/17/2017 1453   LEUKOCYTESUR LARGE (A) 02/17/2017 1453   LEUKOCYTESUR 3+ 04/15/2013 2120    Pertinent Imaging: CT normal  Assessment & Plan:   The patient has abdominal pain and frequency with negative cultures. I sent the urine for culture today. She will return for cystoscopy. Interstitial cystitis would be uncommon in her age group but a hydrodistention may be discussed in the future  Having said that when she pointed to the area of the pain it was almost as high up as the umbilicus. My index of suspicion is low she has interstitial cystitis. The patient did not have microscopic hematuria. In spite of my counseling the patient still wanted to be called in Macrodantin 100 mg twice a day for 1 week. We will call if the culture differs. Return for an ultrasound  1. Dysuria 2. Chronic cystitis - Urinalysis, Complete   No Follow-up on file.  Martina SinnerMACDIARMID,Wyley Hack A, MD  [facility] [address], [address] [place], [place] [address] [phone]

## 2017-04-07 LAB — CULTURE, URINE COMPREHENSIVE

## 2017-04-08 ENCOUNTER — Ambulatory Visit: Payer: Self-pay | Admitting: Urology

## 2017-04-09 ENCOUNTER — Telehealth: Payer: Self-pay

## 2017-04-09 MED ORDER — CIPROFLOXACIN HCL 250 MG PO TABS: 250.0000 mg | ORAL_TABLET | Freq: Two times a day (BID) | ORAL | 0 refills | Status: DC

## 2017-04-09 NOTE — Telephone Encounter (Signed)
Daughter Corrie DandyMary returned call. Gave instructions per Dr. Mina MarbleMacDiarmid's note. Daughter verbalized understanding. States pt never took Macrodantin b/c [facility]. Verified pharmacy on file. Sent Cipro Rx.

## 2017-04-09 NOTE — Telephone Encounter (Signed)
Ashley Harmon, Scott, Ashley Harmon  Ellin GoodieLowe, Phineas Mcenroe S, CMA        Stop macrodantin  cipro 250 mg bid for 7 days    Spoke to patient. She said she did not understand what I was saying and requested I call her daughter Corrie DandyMary so she could get the right understanding. Called daughter. No answer. Called pt back and asked her if she would have daughter contact this office. Pt verbalized understanding.

## 2017-04-12 ENCOUNTER — Ambulatory Visit: Payer: Self-pay | Admitting: Urology

## 2017-05-01 ENCOUNTER — Other Ambulatory Visit: Payer: Medicare PPO

## 2017-05-02 ENCOUNTER — Ambulatory Visit (INDEPENDENT_AMBULATORY_CARE_PROVIDER_SITE_OTHER): Payer: Medicare PPO | Admitting: Urology

## 2017-05-02 ENCOUNTER — Encounter: Payer: Self-pay | Admitting: Urology

## 2017-05-02 VITALS — BP 122/69 | HR 90 | Ht 67.0 in | Wt 161.0 lb

## 2017-05-02 DIAGNOSIS — R300 Dysuria: Secondary | ICD-10-CM

## 2017-05-02 LAB — URINALYSIS, COMPLETE
Bilirubin, UA: NEGATIVE
Glucose, UA: NEGATIVE
KETONES UA: NEGATIVE
Nitrite, UA: NEGATIVE
Protein, UA: NEGATIVE
Urobilinogen, Ur: 0.2 mg/dL (ref 0.2–1.0)
pH, UA: 5.5 (ref 5.0–7.5)

## 2017-05-02 LAB — MICROSCOPIC EXAMINATION: BACTERIA UA: NONE SEEN

## 2017-05-02 MED ORDER — CIPROFLOXACIN HCL 500 MG PO TABS
500.0000 mg | ORAL_TABLET | Freq: Once | ORAL | Status: AC
  Administered 2017-05-02: 500 mg via ORAL

## 2017-05-02 MED ORDER — LIDOCAINE HCL 2 % EX GEL
1.0000 "application " | Freq: Once | CUTANEOUS | Status: AC
  Administered 2017-05-02: 1 via URETHRAL

## 2017-05-02 NOTE — Progress Notes (Signed)
   05/02/17  CC:  Chief Complaint  Patient presents with  . Cysto    HPI: The patient is an 83 year old female who recently went to the emergency room with urgency and burning not responsive to an antibiotic given prior at urgent care. The patient had a normal CT scan without contrast. No urine culture was done on the May 6 visit  The patient for the last several months has abdominal pain. It is worse when she voids. It is not daily. Sometimes she takes Tylenol. She described to her 3 negative cultures. She did however have 1 culture that grew Pseudomonas.  At baseline she voids every 2 or 3 hours and sometimes more frequently. She gets up 2 or 3 times a night. She rarely leaks and does not work pad.  She has constipation and has had a hysterectomy. She has no neurologic issues  She denies a history of kidney stones and previous GU surgery  Her dysuria has since resolved since her last visit.  There were no vitals taken for this visit. NED. A&Ox3.   No respiratory distress   Abd soft, NT, ND Normal external genitalia with patent urethral meatus  Cystoscopy Procedure Note  Patient identification was confirmed, informed consent was obtained, and patient was prepped using Betadine solution.  Lidocaine jelly was administered per urethral meatus.    Preoperative abx where received prior to procedure.    Procedure: - Flexible cystoscope introduced, without any difficulty.   - Thorough search of the bladder revealed:    normal urethral meatus    normal urothelium    no stones    no ulcers     no tumors    no urethral polyps    no trabeculation  - Ureteral orifices were normal in position and appearance.  Post-Procedure: - Patient tolerated the procedure well  Assessment/ Plan:  1. Dysuria This has since resolved. Her cystoscopy today is negative. No further workup needed. Follow-up as needed.  Hildred LaserBrian James Ethin Drummond, MD

## 2017-06-06 NOTE — Progress Notes (Signed)
06/07/2017 10:00 AM   Ashley Harmon July 25, 1931 [id number]  Referring provider: Mickey Farber, MD [address] [name] [place], [place] [address]  Chief Complaint  Patient presents with  . Dysuria    Patient states burning and difficulty urinating    HPI: 83 yo WF who presents today with th complaints of difficulty with urination associated with lower back pain.  Background history The patient recently went to the emergency room with urgency and burning not responsive to an antibiotic given prior at urgent care. The patient had a normal CT scan without contrast. No urine culture was done on the May 6 visit.  The patient for the last several months has abdominal pain. It is worse when she voids. It is not daily. Sometimes she takes Tylenol. She described to her 3 negative cultures.  At baseline she voids every 2 or 3 hours and sometimes more frequently. She gets up 2 or 3 times a night. She rarely leaks and does not work pad.  She has constipation and has had a hysterectomy. She has no neurologic issues  She denies a history of kidney stones and previous GU surgery.  Contrast CT performed in 12/2016 noted no evidence of acute inflammatory process within Abdomen.  No small bowel obstruction. Stable duodenal diverticulum without evidence of acute inflammatory changes.  No hydronephrosis or hydroureter.  Normal appendix.  No pericecal inflammation.  Moderate stool noted within colon suspicious for mild constipation. Colonic diverticula are noted descending colon and proximal sigmoid colon. No evidence of acute colitis or diverticulitis. There is redundant sigmoid colon. Mild to moderate gas noted within mid sigmoid colon. Moderate to abundant gas noted within rectum. No distal colitis or diverticulitis.  Status post hysterectomy.  Degenerative changes thoracolumbar spine. Stable anterolisthesis L5 on S1. Again noted bilateral pars defect at L5 level.  Non contrast CT performed on  02/17/2017 noted no evidence of urinary tract calculus or hydronephrosis. No complicated urinary tract infection identified.  Moderate diffuse atherosclerosis.  Suspected pelvic floor laxity post hysterectomy.  Chronic calcified central disc protrusions at T10-11 and T11-12 and chronic bilateral L5 pars defects. These findings may be symptomatic.  No acute findings seen.  She underwent cystoscopy with Dr. Sherryl Barters on 05/02/2017 was negative.    Today, she is experiencing frequency, urgency, dysuria, nocturia, incontinence and intermittency.  These symptoms have been occurring since April.  She is also having constipation.  She has not had gross hematuria.  She denies fevers, chills, nausea and vomiting.    Her PVR was 400 cc, but she was holding her urine all day for the specimen.  Her UA today was positive for [facility].    PMH: Past Medical History:  Diagnosis Date  . Atherosclerosis of abdominal aorta (HCC) 02/07/2017   Overview:  On 06/24/2015 and 01/08/2017 abdominal CTs  . Chronic constipation 02/07/2017  . GERD (gastroesophageal reflux disease)   . Hypercholesterolemia 02/03/2015  . Hypertension   . Hypothyroidism, unspecified 08/02/2014  . Impaired glucose tolerance 02/07/2017  . Lumbar degenerative disc disease 02/07/2017   Overview:  On 06/24/2015 and 01/08/2017 abdominal CTs  . Osteopenia of left lower leg 02/07/2016  . Thyroid disease     Surgical History: Past Surgical History:  Procedure Laterality Date  . ABDOMINAL HYSTERECTOMY      Home Medications:  Allergies as of 06/07/2017      Reactions   Penicillin V Potassium    Other reaction(s): Unknown      Medication List  Accurate as of 06/07/17 10:00 AM. Always use your most recent med list.          amLODipine 5 MG tablet Commonly known as:  NORVASC   hydrochlorothiazide 25 MG tablet Commonly known as:  HYDRODIURIL   KLOR-CON M20 20 MEQ tablet Generic drug:  potassium chloride SA   levothyroxine 100 MCG  tablet Commonly known as:  SYNTHROID, LEVOTHROID   lisinopril 40 MG tablet Commonly known as:  PRINIVIL,ZESTRIL   pravastatin 10 MG tablet Commonly known as:  PRAVACHOL            Discharge Care Instructions        Start     Ordered   06/07/17 0000  Urine Culture    Question:  Source  Answer:  cath specimen   06/07/17 0956      Allergies:  Allergies  Allergen Reactions  . Penicillin V Potassium     Other reaction(s): Unknown    Family History: Family History  Problem Relation Age of Onset  . Kidney cancer Neg Hx   . Bladder Cancer Neg Hx     Social History:  reports that she has never smoked. She uses smokeless tobacco. She reports that she does not drink alcohol or use drugs.  ROS: UROLOGY Frequent Urination?: Yes Hard to postpone urination?: Yes Burning/pain with urination?: Yes Get up at night to urinate?: Yes Leakage of urine?: Yes Urine stream starts and stops?: Yes Trouble starting stream?: No Do you have to strain to urinate?: No Blood in urine?: Yes Urinary tract infection?: Yes Sexually transmitted disease?: No Injury to kidneys or bladder?: No Painful intercourse?: No Weak stream?: No Currently pregnant?: No Vaginal bleeding?: No Last menstrual period?: n  Gastrointestinal Nausea?: No Vomiting?: No Indigestion/heartburn?: No Diarrhea?: No Constipation?: Yes  Constitutional Fever: No Night sweats?: Yes Weight loss?: No Fatigue?: No  Skin Skin rash/lesions?: No Itching?: No  Eyes Blurred vision?: No Double vision?: No  Ears/Nose/Throat Sore throat?: No Sinus problems?: No  Hematologic/Lymphatic Swollen glands?: No Easy bruising?: Yes  Cardiovascular Leg swelling?: Yes Chest pain?: Yes  Respiratory Cough?: Yes Shortness of breath?: No  Endocrine Excessive thirst?: No  Musculoskeletal Back pain?: Yes Joint pain?: Yes  Neurological Headaches?: No Dizziness?: No  Psychologic Depression?: No Anxiety?:  No  Physical Exam: BP 135/75   Pulse (!) 103   Ht 5\' 7"  (1.702 m)   Wt 162 lb (73.5 kg)   BMI 25.37 kg/m   Constitutional:  Alert and oriented, No acute distress. HEENT: [place] AT, moist mucus membranes.  Trachea midline, no masses. Cardiovascular: No clubbing, cyanosis, or edema. Respiratory: Normal respiratory effort, no increased work of breathing. GI: Abdomen is soft, nontender, nondistended, no abdominal masses GU: No prolapse and moderate vaginal atrophy Skin: No rashes, bruises or suspicious lesions. Lymph: No cervical or inguinal adenopathy. Neurologic: Grossly intact, no focal deficits, moving all 4 extremities. Psychiatric: Normal mood and affect.  Laboratory Data: Lab Results  Component Value Date   WBC 8.4 02/17/2017   HGB 12.6 02/17/2017   HCT 36.2 02/17/2017   MCV 92.6 02/17/2017   PLT 237 02/17/2017    Lab Results  Component Value Date   CREATININE 0.80 02/17/2017    Urinalysis TNTC WBC's.  See EPIC.    I have reviewed the labs  Pertinent Imaging: CLINICAL DATA:  Abdominal distention, bilateral lower abdominal pain for about 3 months, constipation  EXAM: CT ABDOMEN AND PELVIS WITH CONTRAST  TECHNIQUE: Multidetector CT imaging of the abdomen and pelvis  was performed using the standard protocol following bolus administration of intravenous contrast.  CONTRAST:  ISOVUE-300 IOPAMIDOL (ISOVUE-300) INJECTION 61%  COMPARISON:  06/24/2015  FINDINGS: Lower chest: Lung bases shows mild peripheral interstitial prominence probable due to fibrotic changes or chronic interstitial lung disease.  Hepatobiliary: No focal liver abnormality is seen. No gallstones, gallbladder wall thickening, or biliary dilatation.  Pancreas: Unremarkable. No pancreatic ductal dilatation or surrounding inflammatory changes.  Spleen: Normal in size without focal abnormality.  Adrenals/Urinary Tract: No adrenal gland mass. Enhanced kidneys are symmetrical in  size. No hydronephrosis or hydroureter. Delayed renal images shows bilateral renal symmetrical excretion. Bilateral visualized proximal ureter is unremarkable. There is a small cyst in lower pole of the right kidney measures 6 mm.  Stomach/Bowel: Oral contrast material was given to the patient. No gastric outlet obstruction. No small bowel obstruction. No thickened or dilated small bowel loops. The terminal ileum is unremarkable. No pericecal inflammation. Normal retrocecal appendix noted in axial image 50. Moderate stool and contrast material noted within right colon and transverse colon. Small to moderate stool noted within descending colon. Scattered diverticula are noted descending colon. Colonic diverticula are noted proximal sigmoid colon. Some colonic stool noted scratch some colonic stool and gas noted within proximal sigmoid colon. No evidence of acute colitis or diverticulitis. There is redundant sigmoid colon. Moderate gas noted within mid sigmoid colon. Moderate to abundant stool noted within rectum. No distal colonic obstruction. No distal colitis. Stable duodenal diverticulum without evidence of acute inflammatory changes.  Vascular/Lymphatic: Atherosclerotic calcifications of abdominal aorta and iliac arteries are noted.  Reproductive: The patient is status post hysterectomy.  Other: No ascites or free abdominal air. The urinary bladder is unremarkable pre no thickening of urinary bladder wall.  Musculoskeletal: No destructive bony lesions are noted. Sagittal images of the spine shows multilevel degenerative changes thoracolumbar spine. There is bilateral pars defect at L5 level. Stable about 9 mm anterolisthesis L5 on S1 vertebral body. There is Schmorl's node deformity upper and lower endplate of T12 and L1 vertebral bodies.  IMPRESSION: 1. There is no evidence of acute inflammatory process within abdomen. 2. No small bowel obstruction. Stable duodenal  diverticulum without evidence of acute inflammatory changes. 3. No hydronephrosis or hydroureter. 4. Normal appendix.  No pericecal inflammation. 5. Moderate stool noted within colon suspicious for mild constipation. Colonic diverticula are noted descending colon and proximal sigmoid colon. No evidence of acute colitis or diverticulitis. There is redundant sigmoid colon. Mild to moderate gas noted within mid sigmoid colon. Moderate to abundant gas noted within rectum. No distal colitis or diverticulitis. 6. Status post hysterectomy. 7. Degenerative changes thoracolumbar spine. Stable anterolisthesis L5 on S1. Again noted bilateral pars defect at L5 level.   Electronically Signed   By: Natasha Mead M.D.   On: 01/08/2017 14:21  CLINICAL DATA:  Dysuria. Recent urinary tract infection. No improvement in symptoms following therapy.  EXAM: CT ABDOMEN AND PELVIS WITHOUT CONTRAST  TECHNIQUE: Multidetector CT imaging of the abdomen and pelvis was performed following the standard protocol without IV contrast.  COMPARISON:  CT 04/15/2013, 01/08/2017 and 06/24/2015.  FINDINGS: Lower chest: Stable subpleural reticulation at both lung bases, likely chronic. No significant pleural or pericardial effusion.  Hepatobiliary: As visualized in the unenhanced state, the liver appears stable without suspicious findings. No evidence of gallstones, gallbladder wall thickening or biliary dilatation.  Pancreas: Unremarkable. No pancreatic ductal dilatation or surrounding inflammatory changes.  Spleen: Normal in size without focal abnormality.  Adrenals/Urinary Tract: Both adrenal  glands appear normal. There is no evidence of urinary tract calculus or hydronephrosis. Renovascular calcifications are present bilaterally. There is stable mild symmetric perinephric soft tissue stranding bilaterally. The bladder is nearly empty and suboptimally evaluated. Suspected mild pelvic floor  laxity.  Stomach/Bowel: No evidence of bowel wall thickening, distention or surrounding inflammatory change. There are diverticular changes throughout the sigmoid colon. The appendix appears normal. There is a large proximal duodenal diverticulum.  Vascular/Lymphatic: There are no enlarged abdominal or pelvic lymph nodes. Diffuse aortic and branch vessel atherosclerosis.  Reproductive: Hysterectomy. No evidence of adnexal mass. As above, probable pelvic floor laxity.  Other: No evidence of abdominal wall mass or hernia. No ascites.  Musculoskeletal: No acute osseous findings. There are degenerative changes throughout the thoracolumbar spine associated with a convex left scoliosis. There are large central calcified disc protrusions at T10-11 and T11-12 which narrow the AP diameter of the canal and may contribute to mass effect on the distal thoracic cord. These appear unchanged. There are chronic bilateral L5 pars defects with a resulting grade 1 anterolisthesis and moderate foraminal narrowing bilaterally at L5-S1.  IMPRESSION: 1. No evidence of urinary tract calculus or hydronephrosis. No complicated urinary tract infection identified. 2. Moderate diffuse atherosclerosis. 3. Suspected pelvic floor laxity post hysterectomy. 4. Chronic calcified central disc protrusions at T10-11 and T11-12 and chronic bilateral L5 pars defects. These findings may be symptomatic. 5. No acute findings seen.   Electronically Signed   By: Carey Bullocks M.D.   On: 02/17/2017 15:58  Assessment & Plan  1. Dysuria  - chronic off and on for weeks  - UA suspicious for infection  - will send for culture  - will start Macrobid empirically will adjust if necessary once cultures are available  2. Constipation  - explained to the patient that constipation can irritate the bladder and put a patient at risk for recurrent UTI's  - contact PCP for management  3. Disc protrusions  - contact  PCP for management  Return for pending urine culture results.  Michiel Cowboy, PA-C  [facility] [address] [place], [place] [address] [phone]

## 2017-06-07 ENCOUNTER — Ambulatory Visit (INDEPENDENT_AMBULATORY_CARE_PROVIDER_SITE_OTHER): Payer: Medicare PPO | Admitting: Urology

## 2017-06-07 ENCOUNTER — Other Ambulatory Visit: Payer: Self-pay | Admitting: Urology

## 2017-06-07 ENCOUNTER — Other Ambulatory Visit
Admission: RE | Admit: 2017-06-07 | Discharge: 2017-06-07 | Disposition: A | Discharge status: Home / Self Care | Payer: Medicare PPO | Source: Ambulatory Visit | Attending: Urology | Admitting: Urology

## 2017-06-07 ENCOUNTER — Telehealth: Payer: Self-pay | Admitting: *Deleted

## 2017-06-07 ENCOUNTER — Encounter: Payer: Self-pay | Admitting: Urology

## 2017-06-07 ENCOUNTER — Other Ambulatory Visit: Payer: Self-pay | Admitting: *Deleted

## 2017-06-07 VITALS — BP 135/75 | HR 103 | Ht 67.0 in | Wt 162.0 lb

## 2017-06-07 DIAGNOSIS — R300 Dysuria: Secondary | ICD-10-CM | POA: Insufficient documentation

## 2017-06-07 DIAGNOSIS — K5909 Other constipation: Secondary | ICD-10-CM | POA: Diagnosis not present

## 2017-06-07 DIAGNOSIS — M5126 Other intervertebral disc displacement, lumbar region: Secondary | ICD-10-CM

## 2017-06-07 LAB — URINALYSIS, COMPLETE (UACMP) WITH MICROSCOPIC
Bilirubin Urine: NEGATIVE
Glucose, UA: NEGATIVE mg/dL
Ketones, ur: NEGATIVE mg/dL
Nitrite: NEGATIVE
PH: 6 (ref 5.0–8.0)
Protein, ur: NEGATIVE mg/dL
SPECIFIC GRAVITY, URINE: 1.01 (ref 1.005–1.030)
Squamous Epithelial / LPF: NONE SEEN

## 2017-06-07 MED ORDER — NITROFURANTOIN MONOHYD MACRO 100 MG PO CAPS: 100.0000 mg | ORAL_CAPSULE | Freq: Two times a day (BID) | ORAL | 0 refills | Status: DC

## 2017-06-07 NOTE — Telephone Encounter (Signed)
-----   Message from Harle Battiest, PA-C sent at 06/07/2017 11:20 AM EDT ----- Please call Marcha Dutton, patient's daughter, and let her know that I will be sending in an antibiotic to her pharmacy.  It will be Macrobid to the [facility] in [place].

## 2017-06-07 NOTE — Progress Notes (Signed)
In and Out Catheterization  Patient is present today for a I & O catheterization due to dysuria. Patient was cleaned and prepped in a sterile fashion with betadine and Lidocaine 2% jelly was instilled into the urethra.  A 14FR cath was inserted, no complications of urine return was noted, urine was yellow in color. A clean urine sample was collected for urinalysis . Bladder was drained and catheter was removed with out difficulty.    Preformed by: Dallas Schimke CMA

## 2017-06-07 NOTE — Telephone Encounter (Signed)
Spoke with patients daughter Corrie Dandy and let her know that we had called in Macrobid to the [facility] in [place] for her mother. Ok with plan.

## 2017-06-10 LAB — URINE CULTURE: Culture: 70000 — AB

## 2017-06-11 ENCOUNTER — Telehealth: Payer: Self-pay

## 2017-06-11 DIAGNOSIS — N390 Urinary tract infection, site not specified: Secondary | ICD-10-CM

## 2017-06-11 MED ORDER — CIPROFLOXACIN HCL 250 MG PO TABS: 250.0000 mg | ORAL_TABLET | Freq: Two times a day (BID) | ORAL | 0 refills | Status: AC

## 2017-06-11 NOTE — Telephone Encounter (Signed)
Spoke with pt in reference to +ucx. Made aware to stop macrobid and start cipro. Cipro called into pharmacy. Pt voiced understanding.

## 2017-06-11 NOTE — Telephone Encounter (Signed)
-----   Message from Harle Battiest, PA-C sent at 06/10/2017  1:31 PM EDT ----- Please let Mrs. Ashley Harmon know that her urine culture was positive for infection.  She will need to stop the Macrobid and start Cipro 250 mg, bid for 7 days.

## 2017-06-14 ENCOUNTER — Telehealth: Payer: Self-pay | Admitting: Urology

## 2017-06-14 NOTE — Telephone Encounter (Signed)
Please have Ashley Harmon follow up after she finishes her antibiotic so will can address her other urinary issues.

## 2017-06-14 NOTE — Telephone Encounter (Signed)
Spoke with pt in reference to having a f/u post abx therapy. OV f/u was made. Pt voiced understanding.

## 2017-06-23 NOTE — Progress Notes (Signed)
06/24/2017 3:20 PM   Ashley Harmon 17-Mar-1931 [id number]  Referring provider: Ezequiel Kayser, MD [facility] [place], [place] [address]  Chief Complaint  Patient presents with  . Follow-up    after antibiotics per Larene Beach     HPI: 83 yo WF who presents today for follow up after being treated for a Pseudomonas UTI to address her nocturia and incontinence.    Background history The patient recently went to the emergency room with urgency and burning not responsive to an antibiotic given prior at urgent care. The patient had a normal CT scan without contrast. No urine culture was Harmon on the May 6 visit.  The patient for the last several months has abdominal pain. It is worse when she voids. It is not daily. Sometimes she takes Tylenol. She described to her 3 negative cultures.  At baseline she voids every 2 or 3 hours and sometimes more frequently. She gets up 2 or 3 times a night. She rarely leaks and does not work pad.  She has constipation and has had a hysterectomy. She has no neurologic issues  She denies a history of kidney stones and previous GU surgery.  Contrast CT performed in 12/2016 noted no evidence of acute inflammatory process within Abdomen.  No small bowel obstruction. Stable duodenal diverticulum without evidence of acute inflammatory changes.  No hydronephrosis or hydroureter.  Normal appendix.  No pericecal inflammation.  Moderate stool noted within colon suspicious for mild constipation. Colonic diverticula are noted descending colon and proximal sigmoid colon. No evidence of acute colitis or diverticulitis. There is redundant sigmoid colon. Mild to moderate gas noted within mid sigmoid colon. Moderate to abundant gas noted within rectum. No distal colitis or diverticulitis.  Status post hysterectomy.  Degenerative changes thoracolumbar spine. Stable anterolisthesis L5 on S1. Again noted bilateral pars defect at L5 level.  Non contrast CT performed on  02/17/2017 noted no evidence of urinary tract calculus or hydronephrosis. No complicated urinary tract infection identified.  Moderate diffuse atherosclerosis.  Suspected pelvic floor laxity post hysterectomy.  Chronic calcified central disc protrusions at T10-11 and T11-12 and chronic bilateral L5 pars defects. These findings may be symptomatic.  No acute findings seen.  She underwent cystoscopy with Dr. Pilar Jarvis on 05/02/2017 was negative.    At her last visit, she was experiencing frequency, urgency, dysuria, nocturia, incontinence and intermittency.  These symptoms have been occurring since April.  She is also having constipation.  She has not had gross hematuria.  She denies fevers, chills, nausea and vomiting.  Her PVR was 400 cc, but she was holding her urine all day for the specimen.  Her UA was positive for [facility].    Today, she states she is no longer experiencing frequency, urgency, dysuria, nocturia, incontinence or intermittency. She states the symptoms abated after taking antibiotics. Her PVR 0 mL. She still has constipation but is treating it with over-the-counter stool softeners and feels they are effective. She denies any dysuria, gross hematuria or suprapubic pain. She's not had fevers, chills, nausea or vomiting.    PMH: Past Medical History:  Diagnosis Date  . Atherosclerosis of abdominal aorta ([place]) 02/07/2017   Overview:  On 06/24/2015 and 01/08/2017 abdominal CTs  . Chronic constipation 02/07/2017  . GERD (gastroesophageal reflux disease)   . Hypercholesterolemia 02/03/2015  . Hypertension   . Hypothyroidism, unspecified 08/02/2014  . Impaired glucose tolerance 02/07/2017  . Lumbar degenerative disc disease 02/07/2017   Overview:  On 06/24/2015  and 01/08/2017 abdominal CTs  . Osteopenia of left lower leg 02/07/2016  . Thyroid disease     Surgical History: Past Surgical History:  Procedure Laterality Date  . ABDOMINAL HYSTERECTOMY      Home Medications:  Allergies as of  06/24/2017      Reactions   Penicillin V Potassium    Other reaction(s): Unknown      Medication List       Accurate as of 06/24/17  3:20 PM. Always use your most recent med list.          amLODipine 5 MG tablet Commonly known as:  NORVASC   conjugated estrogens vaginal cream Commonly known as:  PREMARIN Place 1 Applicatorful vaginally daily. Apply 0.35m (pea-sized amount)  just inside the vaginal introitus with a finger-tip every night for two weeks and then Monday, Wednesday and Friday nights.   hydrochlorothiazide 25 MG tablet Commonly known as:  HYDRODIURIL   KLOR-CON M20 20 MEQ tablet Generic drug:  potassium chloride SA   levothyroxine 100 MCG tablet Commonly known as:  SYNTHROID, LEVOTHROID   lisinopril 40 MG tablet Commonly known as:  PRINIVIL,ZESTRIL   nitrofurantoin (macrocrystal-monohydrate) 100 MG capsule Commonly known as:  MACROBID Take 1 capsule (100 mg total) by mouth every 12 (twelve) hours.   pravastatin 10 MG tablet Commonly known as:  PRAVACHOL   traMADol 50 MG tablet Commonly known as:  ULTRAM Take by mouth.            Discharge Care Instructions        Start     Ordered   06/24/17 0000  BLADDER SCAN AMB NON-IMAGING     06/24/17 1417   06/24/17 0000  conjugated estrogens (PREMARIN) vaginal cream  Daily    Question:  Supervising Provider  Answer:  BHollice Espy  06/24/17 1514      Allergies:  Allergies  Allergen Reactions  . Penicillin V Potassium     Other reaction(s): Unknown    Family History: Family History  Problem Relation Age of Onset  . Kidney cancer Neg Hx   . Bladder Cancer Neg Hx     Social History:  reports that she has never smoked. Her smokeless tobacco use includes Snuff. She reports that she does not drink alcohol or use drugs.  ROS: UROLOGY Frequent Urination?: No Hard to postpone urination?: No Burning/pain with urination?: No Get up at night to urinate?: No Leakage of urine?: No Urine stream  starts and stops?: No Trouble starting stream?: No Do you have to strain to urinate?: No Blood in urine?: No Urinary tract infection?: No Sexually transmitted disease?: No Injury to kidneys or bladder?: No Painful intercourse?: No Weak stream?: No Currently pregnant?: No Vaginal bleeding?: No Last menstrual period?: n  Gastrointestinal Nausea?: No Vomiting?: No Indigestion/heartburn?: No Diarrhea?: No Constipation?: Yes  Constitutional Fever: No Night sweats?: No Weight loss?: No Fatigue?: No  Skin Skin rash/lesions?: No Itching?: No  Eyes Blurred vision?: No Double vision?: No  Ears/Nose/Throat Sore throat?: No Sinus problems?: No  Hematologic/Lymphatic Swollen glands?: No Easy bruising?: No  Cardiovascular Leg swelling?: No Chest pain?: No  Respiratory Cough?: No Shortness of breath?: No  Endocrine Excessive thirst?: No  Musculoskeletal Back pain?: No Joint pain?: No  Neurological Headaches?: No Dizziness?: No  Psychologic Depression?: No Anxiety?: No  Physical Exam: BP (!) 143/78   Pulse (!) 112   Ht 5' 7" (1.702 m)   Wt 160 lb 4.8 oz (72.7 kg)   BMI 25.11  kg/m   Constitutional:  Alert and oriented, No acute distress. HEENT: [place] AT, moist mucus membranes.  Trachea midline, no masses. Cardiovascular: No clubbing, cyanosis, or edema. Respiratory: Normal respiratory effort, no increased work of breathing. GI: Abdomen is soft, nontender, nondistended, no abdominal masses GU: No prolapse and moderate vaginal atrophy Skin: No rashes, bruises or suspicious lesions. Lymph: No cervical or inguinal adenopathy. Neurologic: Grossly intact, no focal deficits, moving all 4 extremities. Psychiatric: Normal mood and affect.  Laboratory Data: Lab Results  Component Value Date   WBC 8.4 02/17/2017   HGB 12.6 02/17/2017   HCT 36.2 02/17/2017   MCV 92.6 02/17/2017   PLT 237 02/17/2017    Lab Results  Component Value Date   CREATININE 0.80  02/17/2017   I have reviewed the labs  Pertinent Imaging: Results for BETTINA, WARN (MRN [id number]) as of 06/24/2017 15:16  Ref. Range 06/24/2017 14:27  Scan Result Unknown 0    Assessment & Plan  1. Recurrent UTI's  - criteria for recurrent UTI has been met with 2 or more infections in 6 months or 3 or greater infections in one year   - Patient is instructed to increase their water intake until the urine is pale yellow or clear (10 to 12 cups daily)   - probiotics (yogurt, oral pills or vaginal suppositories), take cranberry pills or drink the juice and Vitamin C 1,000 mg daily to acidify the urine should be added to their daily regimen   - avoid soaking in tubs and wipe front to back after urinating   - advised them to have CATH UA's for urinalysis and culture to prevent skin contamination of the specimen  - reviewed symptoms of UTI and advised not to have urine checked or be treated for UTI if not experiencing symptoms  - discussed antibiotic stewardship with the patient    2. Vaginal atrophy  - I explained to the patient that when women go through menopause and her estrogen levels are severely diminished, the normal vaginal flora will change.  This is due to an increase of the vaginal canal's pH. Because of this, the vaginal canal may be colonized by bacteria from the rectum instead of the protective lactobacillus.  This accompanied by the loss of the mucus barrier with vaginal atrophy is a cause of recurrent urinary tract infections.  - In some studies, the use of vaginal estrogen cream has been demonstrated to reduce  recurrent urinary tract infections to one a year.   - given prescriptions for Premarin cream  - She will follow up in three months for an exam.                                   3. Constipation  - explained to the patient that constipation can irritate the bladder and put a patient at risk for recurrent UTI's  - contact PCP for management   Return in about 3 months  (around 09/23/2017) for OAB questionnaire, PVR and exam.  Zara Council, [facility]  [place] [address], [place], [place] [address] [phone]

## 2017-06-24 ENCOUNTER — Ambulatory Visit (INDEPENDENT_AMBULATORY_CARE_PROVIDER_SITE_OTHER): Payer: Medicare PPO | Admitting: Urology

## 2017-06-24 ENCOUNTER — Ambulatory Visit: Payer: Self-pay | Admitting: Urology

## 2017-06-24 ENCOUNTER — Encounter: Payer: Self-pay | Admitting: Urology

## 2017-06-24 VITALS — BP 143/78 | HR 112 | Ht 67.0 in | Wt 160.3 lb

## 2017-06-24 DIAGNOSIS — K5909 Other constipation: Secondary | ICD-10-CM

## 2017-06-24 DIAGNOSIS — N952 Postmenopausal atrophic vaginitis: Secondary | ICD-10-CM

## 2017-06-24 DIAGNOSIS — N390 Urinary tract infection, site not specified: Secondary | ICD-10-CM | POA: Diagnosis not present

## 2017-06-24 LAB — BLADDER SCAN AMB NON-IMAGING: SCAN RESULT: 0

## 2017-06-24 MED ORDER — ESTROGENS, CONJUGATED 0.625 MG/GM VA CREA: 1.0000 | TOPICAL_CREAM | Freq: Every day | VAGINAL | 12 refills | Status: DC

## 2017-06-25 ENCOUNTER — Other Ambulatory Visit: Payer: Self-pay | Admitting: Urology

## 2017-06-25 ENCOUNTER — Telehealth: Payer: Self-pay | Admitting: Urology

## 2017-06-25 MED ORDER — ESTRADIOL 0.1 MG/GM VA CREA: TOPICAL_CREAM | VAGINAL | 12 refills | Status: DC

## 2017-06-25 NOTE — Telephone Encounter (Signed)
Script printed.

## 2017-06-25 NOTE — Telephone Encounter (Signed)
Patient called the pharmacy and they said they would cover Estrace and to call that in and see how much that would be with insurance.   Thanks, Ashley DusterMichelle

## 2017-06-26 ENCOUNTER — Ambulatory Visit: Payer: Self-pay | Admitting: Urology

## 2017-09-25 ENCOUNTER — Ambulatory Visit: Payer: Medicare PPO | Admitting: Urology

## 2017-10-24 ENCOUNTER — Encounter: Payer: Self-pay | Admitting: Emergency Medicine

## 2017-10-24 ENCOUNTER — Emergency Department: Payer: Medicare PPO

## 2017-10-24 ENCOUNTER — Emergency Department
Admission: EM | Admit: 2017-10-24 | Discharge: 2017-10-24 | Disposition: A | Discharge status: Home / Self Care | Payer: Medicare PPO | Attending: Emergency Medicine | Admitting: Emergency Medicine

## 2017-10-24 ENCOUNTER — Other Ambulatory Visit: Payer: Self-pay

## 2017-10-24 DIAGNOSIS — I2510 Atherosclerotic heart disease of native coronary artery without angina pectoris: Secondary | ICD-10-CM | POA: Diagnosis not present

## 2017-10-24 DIAGNOSIS — Z79899 Other long term (current) drug therapy: Secondary | ICD-10-CM | POA: Diagnosis not present

## 2017-10-24 DIAGNOSIS — S40012A Contusion of left shoulder, initial encounter: Secondary | ICD-10-CM | POA: Insufficient documentation

## 2017-10-24 DIAGNOSIS — E039 Hypothyroidism, unspecified: Secondary | ICD-10-CM | POA: Insufficient documentation

## 2017-10-24 DIAGNOSIS — W010XXA Fall on same level from slipping, tripping and stumbling without subsequent striking against object, initial encounter: Secondary | ICD-10-CM | POA: Insufficient documentation

## 2017-10-24 DIAGNOSIS — Y9389 Activity, other specified: Secondary | ICD-10-CM | POA: Insufficient documentation

## 2017-10-24 DIAGNOSIS — Y929 Unspecified place or not applicable: Secondary | ICD-10-CM | POA: Diagnosis not present

## 2017-10-24 DIAGNOSIS — I10 Essential (primary) hypertension: Secondary | ICD-10-CM | POA: Insufficient documentation

## 2017-10-24 DIAGNOSIS — S4992XA Unspecified injury of left shoulder and upper arm, initial encounter: Secondary | ICD-10-CM | POA: Diagnosis present

## 2017-10-24 DIAGNOSIS — Y998 Other external cause status: Secondary | ICD-10-CM | POA: Diagnosis not present

## 2017-10-24 MED ORDER — MORPHINE SULFATE (PF) 2 MG/ML IV SOLN
2.0000 mg | Freq: Once | INTRAVENOUS | Status: AC
Start: 2017-10-24 — End: 2017-10-24
  Administered 2017-10-24: 2 mg via INTRAMUSCULAR
  Filled 2017-10-24: qty 1

## 2017-10-24 MED ORDER — TRAMADOL HCL 50 MG PO TABS: 50.0000 mg | ORAL_TABLET | Freq: Four times a day (QID) | ORAL | 0 refills | Status: DC | PRN

## 2017-10-24 NOTE — ED Notes (Signed)
Pt taken to POV in wheelchair. VSS. Pain is manageable. Discharge instruction, RX and follow up reviewed. No questions or concerns voiced.

## 2017-10-24 NOTE — ED Triage Notes (Signed)
Pt presents to ED from [facility] with possible anterior shoulder dislocation to L side. Per note pt was unable to tolerate complete shoulder X-ray and was only able to obtain 1 view which was inconclusive as to dislocation or fracture. Pt fell earlier today and landed on L shoulder, swelling noted to L shoulder and pt is noted to be guarding, unsure if deformity or if how patient is holding her arm for comfort.

## 2017-10-24 NOTE — Discharge Instructions (Signed)
Return to the ER for new or worsening pain, weakness or numbness, worsening difficulty moving the arm, or any other new or worsening symptoms that concern you.  You should follow-up with the primary care as needed, and can also arrange follow-up with an orthopedist if there is any persistent pain.  Take Tylenol as the primary pain reliever although you may use the tramadol prescribed today for more severe pain.

## 2017-10-24 NOTE — ED Provider Notes (Signed)
[facility] Emergency Department Provider Note ____________________________________________   First MD Initiated Contact with Patient 10/24/17 2003     (approximate)  I have reviewed the triage vital signs and the nursing notes.   HISTORY  Chief Complaint Fall and Shoulder Pain    HPI Ashley Harmon is a 84 y.o. female with past medical history as noted below who presents with left shoulder injury, acute onset several hours ago when she fell from standing height, associated with pain in the shoulder but not with any numbness or weakness.  Patient denies hitting her head, and denies any other injuries or pain in other parts of her body.  She states she has difficulty ranging her moving the left shoulder.    The patient went to urgent care but due to her pain and difficulty ranging the shoulder, they were only able to obtain a single view x-ray of the shoulder and could not therefore rule out dislocation.  The patient was sent to the ED for additional analgesia and imaging.  The patient states that she was walking in her home and "just fell," but states that she is clumsy and this is happened before.  She denies feeling weak or dizzy, denies passing out.  She states she did not hit her head and did not have LOC after the fall.   Past Medical History:  Diagnosis Date  . Atherosclerosis of abdominal aorta ([facility]) 02/07/2017   Overview:  On 06/24/2015 and 01/08/2017 abdominal CTs  . Chronic constipation 02/07/2017  . GERD (gastroesophageal reflux disease)   . Hypercholesterolemia 02/03/2015  . Hypertension   . Hypothyroidism, unspecified 08/02/2014  . Impaired glucose tolerance 02/07/2017  . Lumbar degenerative disc disease 02/07/2017   Overview:  On 06/24/2015 and 01/08/2017 abdominal CTs  . Osteopenia of left lower leg 02/07/2016  . Thyroid disease     Patient Active Problem List   Diagnosis Date Noted  . Lumbar degenerative disc disease 02/07/2017  . Impaired  glucose tolerance 02/07/2017  . High risk medication use 02/07/2017  . Chronic constipation 02/07/2017  . Atherosclerosis of abdominal aorta ([facility]) 02/07/2017  . Osteopenia of left lower leg 02/07/2016  . History of pancreatitis 08/08/2015  . Hypercholesterolemia 02/03/2015  . Hypothyroidism, unspecified 08/02/2014  . Hypertension 08/02/2014    Past Surgical History:  Procedure Laterality Date  . ABDOMINAL HYSTERECTOMY      Prior to Admission medications   Medication Sig Start Date End Date Taking? Authorizing Provider  amLODipine (NORVASC) 5 MG tablet  03/01/17   [provider]  conjugated estrogens (PREMARIN) vaginal cream Place 1 Applicatorful vaginally daily. Apply 0.5mg  (pea-sized amount)  just inside the vaginal introitus with a finger-tip every night for two weeks and then Monday, Wednesday and Friday nights. 06/24/17   Michiel CowboyMcGowan, Shannon A, PA-C  estradiol (ESTRACE VAGINAL) 0.1 MG/GM vaginal cream Apply 0.5mg  (pea-sized amount)  just inside the vaginal introitus with a finger-tip every night for two weeks and then Monday, Wednesday and Friday nights. 06/25/17   Michiel CowboyMcGowan, Shannon A, PA-C  hydrochlorothiazide (HYDRODIURIL) 25 MG tablet  03/01/17   [provider]  KLOR-CON M20 20 MEQ tablet  03/13/17   [provider]  levothyroxine (SYNTHROID, LEVOTHROID) 100 MCG tablet  03/25/17   [provider]  lisinopril (PRINIVIL,ZESTRIL) 40 MG tablet  03/01/17   [provider]  nitrofurantoin, macrocrystal-monohydrate, (MACROBID) 100 MG capsule Take 1 capsule (100 mg total) by mouth every 12 (twelve) hours. Patient not taking: Reported on 06/24/2017 06/07/17  Michiel Cowboy A, PA-C  pravastatin (PRAVACHOL) 10 MG tablet  03/25/17   [provider]  traMADol Janean Sark) 50 MG tablet Take by mouth. 06/11/17   [provider]    Allergies Penicillin v potassium  Family History  Problem Relation Age of Onset  . Kidney cancer Neg Hx   .  Bladder Cancer Neg Hx     Social History Social History   Tobacco Use  . Smoking status: Never Smoker  . Smokeless tobacco: Current User    Types: Snuff  Substance Use Topics  . Alcohol use: No  . Drug use: No    Review of Systems  Constitutional: No fever. Eyes: No redness. ENT: No neck pain. Cardiovascular: Denies chest pain. Respiratory: Denies shortness of breath. Gastrointestinal: No abdominal pain.  Genitourinary: Negative for flank pain.  Musculoskeletal: Negative for back pain. Skin: Negative for abrasions or lacerations. Neurological: Negative for focal weakness or numbness.   ____________________________________________   PHYSICAL EXAM:  VITAL SIGNS: ED Triage Vitals  Enc Vitals Group     BP 10/24/17 1902 128/77     Pulse Rate 10/24/17 1902 (!) 106     Resp 10/24/17 1902 18     Temp 10/24/17 1902 98 F (36.7 C)     Temp Source 10/24/17 1902 Oral     SpO2 10/24/17 1902 98 %     Weight 10/24/17 1902 160 lb (72.6 kg)     Height 10/24/17 1902 5\' 6"  (1.676 m)     Head Circumference --      Peak Flow --      Pain Score 10/24/17 1901 8     Pain Loc --      Pain Edu? --      Excl. in GC? --     Constitutional: Alert and oriented.  Uncomfortable appearing but in no acute distress. Eyes: Conjunctivae are normal.  EOMI. Head: Atraumatic. Nose: No congestion/rhinnorhea. Mouth/Throat: Mucous membranes are moist.   Neck: Normal range of motion.  Cervical spine with no midline tenderness. Cardiovascular:  Good peripheral circulation.  Chest wall nontender. Respiratory: Normal respiratory effort.  No retractions.  No rib tenderness. Gastrointestinal: Soft and nontender. No distention.  Genitourinary: No flank tenderness. Musculoskeletal:  Extremities warm and well perfused.  Pain on any range of motion of the left shoulder.  Mild anterior bony tenderness.  No deformity.  2+ radial pulse.  No midline spinal tenderness.  No pain or deformity to left elbow or  wrist. Neurologic: Motor and sensory intact in all extremities.  LUE motor and sensory intact in median/radial/ulnar distributions. Skin:  Skin is warm and dry. No rash noted. Psychiatric: Mood and affect are normal. Speech and behavior are normal.  ____________________________________________   LABS (all labs ordered are listed, but only abnormal results are displayed)  Labs Reviewed - No data to display ____________________________________________  EKG   ____________________________________________  RADIOLOGY  XR left shoulder: No acute fracture or dislocation.  Large inferior humeral head osteophyte.  ____________________________________________   PROCEDURES  Procedure(s) performed: No    Critical Care performed: No ____________________________________________   INITIAL IMPRESSION / ASSESSMENT AND PLAN / ED COURSE  Pertinent labs & imaging results that were available during my care of the patient were reviewed by me and considered in my medical decision making (see chart for details).  84 year old female with past medical history as noted above presents after an apparent mechanical fall from standing height, directly onto her left shoulder with associated pain.  She was referred  here from urgent care as they were unable to obtain adequate imaging due to the patient's pain at that time.  Exam is as described above; there is significant pain to the left shoulder but no deformity and the left upper extremity is neuro/vascular intact.  Patient has no evidence of other injuries.  Here the x-ray techs were able to obtain an adequate 3 view x-ray of the left shoulder which shows no fracture or dislocation.  There is a large osteophyte inferior to the humeral head.  Overall given the patient's relatively severe level pain with this normal x-ray, I suspect that she likely struck this osteophyte causing her pain.  There is no evidence of fracture to the other surrounding areas  either clinically or by imaging.  No indication for additional imaging.  The primary goal at this time will be pain control; I will give IM morphine and reassess.  Given the patient's advanced age will avoid stronger narcotics but I will consider tramadol or similar medication from.  ----------------------------------------- 9:11 PM on 10/24/2017 -----------------------------------------  Patient's pain is significantly improved after the small dose of morphine.  The family feels comfortable with taking her home.  The patient feels well to go home.  I will give a prescription for tramadol but encouraged the patient to take Tylenol as the primary pain reliever and tramadol only for more severe pain.  Return precautions given, and the patient and family expressed understanding. ____________________________________________   FINAL CLINICAL IMPRESSION(S) / ED DIAGNOSES  Final diagnoses:  Contusion of left shoulder, initial encounter      NEW MEDICATIONS STARTED DURING THIS VISIT:  New Prescriptions   No medications on file     Note:  This document was prepared using Dragon voice recognition software and may include unintentional dictation errors.    Dionne Bucy, MD 10/24/17 2112

## 2018-03-21 ENCOUNTER — Other Ambulatory Visit: Payer: Self-pay | Admitting: Specialist

## 2018-03-21 DIAGNOSIS — R9389 Abnormal findings on diagnostic imaging of other specified body structures: Secondary | ICD-10-CM

## 2018-03-21 DIAGNOSIS — R053 Chronic cough: Secondary | ICD-10-CM

## 2018-03-21 DIAGNOSIS — R0609 Other forms of dyspnea: Principal | ICD-10-CM

## 2018-03-21 DIAGNOSIS — R05 Cough: Secondary | ICD-10-CM

## 2018-03-31 ENCOUNTER — Ambulatory Visit
Admission: RE | Admit: 2018-03-31 | Discharge: 2018-03-31 | Disposition: A | Discharge status: Home / Self Care | Payer: Medicare PPO | Source: Ambulatory Visit | Attending: Specialist | Admitting: Specialist

## 2018-03-31 DIAGNOSIS — I2510 Atherosclerotic heart disease of native coronary artery without angina pectoris: Secondary | ICD-10-CM | POA: Insufficient documentation

## 2018-03-31 DIAGNOSIS — I700 Atherosclerosis of aorta: Secondary | ICD-10-CM | POA: Diagnosis not present

## 2018-03-31 DIAGNOSIS — J90 Pleural effusion, not elsewhere classified: Secondary | ICD-10-CM | POA: Insufficient documentation

## 2018-03-31 DIAGNOSIS — R9389 Abnormal findings on diagnostic imaging of other specified body structures: Secondary | ICD-10-CM | POA: Diagnosis not present

## 2018-03-31 DIAGNOSIS — I712 Thoracic aortic aneurysm, without rupture: Secondary | ICD-10-CM | POA: Diagnosis not present

## 2018-03-31 DIAGNOSIS — R590 Localized enlarged lymph nodes: Secondary | ICD-10-CM | POA: Insufficient documentation

## 2018-03-31 DIAGNOSIS — R05 Cough: Secondary | ICD-10-CM | POA: Diagnosis not present

## 2018-03-31 DIAGNOSIS — I2721 Secondary pulmonary arterial hypertension: Secondary | ICD-10-CM | POA: Diagnosis not present

## 2018-03-31 DIAGNOSIS — R0609 Other forms of dyspnea: Secondary | ICD-10-CM | POA: Diagnosis present

## 2018-03-31 DIAGNOSIS — R053 Chronic cough: Secondary | ICD-10-CM

## 2018-04-03 ENCOUNTER — Other Ambulatory Visit: Payer: Self-pay | Admitting: Specialist

## 2018-04-03 DIAGNOSIS — R9389 Abnormal findings on diagnostic imaging of other specified body structures: Secondary | ICD-10-CM

## 2018-04-11 ENCOUNTER — Ambulatory Visit: Payer: Medicare PPO

## 2018-04-15 ENCOUNTER — Inpatient Hospital Stay: Payer: Medicare PPO | Attending: Internal Medicine | Admitting: Internal Medicine

## 2018-04-15 ENCOUNTER — Inpatient Hospital Stay: Payer: Medicare PPO

## 2018-04-15 ENCOUNTER — Other Ambulatory Visit: Payer: Self-pay

## 2018-04-15 ENCOUNTER — Inpatient Hospital Stay: Payer: Medicare PPO | Admitting: Internal Medicine

## 2018-04-15 ENCOUNTER — Encounter: Payer: Self-pay | Admitting: Internal Medicine

## 2018-04-15 DIAGNOSIS — R531 Weakness: Secondary | ICD-10-CM

## 2018-04-15 DIAGNOSIS — Z79899 Other long term (current) drug therapy: Secondary | ICD-10-CM | POA: Insufficient documentation

## 2018-04-15 DIAGNOSIS — R630 Anorexia: Secondary | ICD-10-CM | POA: Insufficient documentation

## 2018-04-15 DIAGNOSIS — I714 Abdominal aortic aneurysm, without rupture: Secondary | ICD-10-CM | POA: Insufficient documentation

## 2018-04-15 DIAGNOSIS — R05 Cough: Secondary | ICD-10-CM | POA: Insufficient documentation

## 2018-04-15 DIAGNOSIS — I10 Essential (primary) hypertension: Secondary | ICD-10-CM | POA: Diagnosis not present

## 2018-04-15 DIAGNOSIS — R634 Abnormal weight loss: Secondary | ICD-10-CM | POA: Diagnosis not present

## 2018-04-15 DIAGNOSIS — E039 Hypothyroidism, unspecified: Secondary | ICD-10-CM | POA: Insufficient documentation

## 2018-04-15 DIAGNOSIS — M549 Dorsalgia, unspecified: Secondary | ICD-10-CM | POA: Insufficient documentation

## 2018-04-15 DIAGNOSIS — K5909 Other constipation: Secondary | ICD-10-CM | POA: Insufficient documentation

## 2018-04-15 DIAGNOSIS — M5136 Other intervertebral disc degeneration, lumbar region: Secondary | ICD-10-CM

## 2018-04-15 DIAGNOSIS — J90 Pleural effusion, not elsewhere classified: Secondary | ICD-10-CM

## 2018-04-15 DIAGNOSIS — E7800 Pure hypercholesterolemia, unspecified: Secondary | ICD-10-CM | POA: Diagnosis not present

## 2018-04-15 DIAGNOSIS — M2550 Pain in unspecified joint: Secondary | ICD-10-CM

## 2018-04-15 DIAGNOSIS — M8580 Other specified disorders of bone density and structure, unspecified site: Secondary | ICD-10-CM | POA: Insufficient documentation

## 2018-04-15 DIAGNOSIS — R599 Enlarged lymph nodes, unspecified: Secondary | ICD-10-CM | POA: Diagnosis not present

## 2018-04-15 DIAGNOSIS — R918 Other nonspecific abnormal finding of lung field: Secondary | ICD-10-CM | POA: Insufficient documentation

## 2018-04-15 DIAGNOSIS — K219 Gastro-esophageal reflux disease without esophagitis: Secondary | ICD-10-CM | POA: Diagnosis not present

## 2018-04-15 DIAGNOSIS — Z7982 Long term (current) use of aspirin: Secondary | ICD-10-CM | POA: Diagnosis not present

## 2018-04-15 DIAGNOSIS — R5383 Other fatigue: Secondary | ICD-10-CM | POA: Diagnosis not present

## 2018-04-15 DIAGNOSIS — R0602 Shortness of breath: Secondary | ICD-10-CM | POA: Diagnosis not present

## 2018-04-15 DIAGNOSIS — I712 Thoracic aortic aneurysm, without rupture: Secondary | ICD-10-CM | POA: Diagnosis not present

## 2018-04-15 LAB — CBC WITH DIFFERENTIAL/PLATELET
Basophils Absolute: 0.1 10*3/uL (ref 0–0.1)
Basophils Relative: 1 %
EOS PCT: 1 %
Eosinophils Absolute: 0.1 10*3/uL (ref 0–0.7)
HCT: 38.2 % (ref 35.0–47.0)
Hemoglobin: 13.3 g/dL (ref 12.0–16.0)
LYMPHS ABS: 1.4 10*3/uL (ref 1.0–3.6)
LYMPHS PCT: 17 %
MCH: 31.7 pg (ref 26.0–34.0)
MCHC: 34.9 g/dL (ref 32.0–36.0)
MCV: 90.9 fL (ref 80.0–100.0)
MONO ABS: 1 10*3/uL — AB (ref 0.2–0.9)
Monocytes Relative: 13 %
Neutro Abs: 5.3 10*3/uL (ref 1.4–6.5)
Neutrophils Relative %: 68 %
PLATELETS: 323 10*3/uL (ref 150–440)
RBC: 4.2 MIL/uL (ref 3.80–5.20)
RDW: 13.9 % (ref 11.5–14.5)
WBC: 7.8 10*3/uL (ref 3.6–11.0)

## 2018-04-15 LAB — COMPREHENSIVE METABOLIC PANEL
ALT: 13 U/L (ref 0–44)
AST: 27 U/L (ref 15–41)
Albumin: 3.9 g/dL (ref 3.5–5.0)
Alkaline Phosphatase: 79 U/L (ref 38–126)
Anion gap: 12 (ref 5–15)
BUN: 11 mg/dL (ref 8–23)
CHLORIDE: 95 mmol/L — AB (ref 98–111)
CO2: 27 mmol/L (ref 22–32)
Calcium: 9.5 mg/dL (ref 8.9–10.3)
Creatinine, Ser: 0.84 mg/dL (ref 0.44–1.00)
GFR calc Af Amer: >60 mL/min (ref >60)
GFR calc non Af Amer: >60 mL/min (ref >60)
GLUCOSE: 108 mg/dL — AB (ref 70–99)
POTASSIUM: 3.8 mmol/L (ref 3.5–5.1)
SODIUM: 134 mmol/L — AB (ref 135–145)
Total Bilirubin: 0.8 mg/dL (ref 0.3–1.2)
Total Protein: 8.5 g/dL — ABNORMAL HIGH (ref 6.5–8.1)

## 2018-04-15 LAB — LACTATE DEHYDROGENASE: LDH: 254 U/L — AB (ref 98–192)

## 2018-04-15 LAB — APTT: APTT: 27 s (ref 24–36)

## 2018-04-15 NOTE — Progress Notes (Signed)
[name], [name], [name] 84 y.o.  female non-smoker-has been referred to us for further evaluation recommendations for left lung mass/mediastinal adenopathy.  Patient states that she noted to have a cough with phlegm no hemoptysis for the last few months which led to a chest x-ray which was quite abnormal.  This led to a CT scan-June 2019 that showed left upper lobe mass; consolidation/lymphangitic spread; and mild to moderate mediastinal adenopathy.  Patient has been evaluated by pulmonary; and she is referred to us for further evaluation/management.  Patient admits to poor appetite.  Admits to weight loss.  No nausea no vomiting.  Has chronic joint pains back pain not any worse.  She had recent falls because of mechanical instability.  Denies any syncopal episodes or headaches.   Review of Systems  Constitutional: Positive for malaise/fatigue and weight loss. Negative for chills, diaphoresis and fever.  HENT: Negative for nosebleeds and sore throat.   Eyes: Negative for double vision.  Respiratory: Positive for cough, sputum production and shortness of breath. Negative for hemoptysis and wheezing.   Cardiovascular: Negative for chest pain, palpitations, orthopnea and leg swelling.  Gastrointestinal: Negative for abdominal pain, blood in stool, constipation, diarrhea, heartburn, melena, nausea and vomiting.  Genitourinary: Negative for dysuria, frequency and urgency.  Musculoskeletal: Positive for back pain and joint pain.  Skin: Negative.  Negative for itching and rash.  Neurological: Negative for dizziness, tingling, focal weakness, weakness and headaches.  Endo/Heme/Allergies: Does not bruise/bleed easily.   Psychiatric/Behavioral: Negative for depression. The patient is not nervous/anxious and does not have insomnia.      MEDICAL HISTORY:  Past Medical History:  Diagnosis Date  . Atherosclerosis of abdominal aorta (HCC) 02/07/2017   Overview:  On 06/24/2015 and 01/08/2017 abdominal CTs  . Chronic constipation 02/07/2017  . GERD (gastroesophageal reflux disease)   . Hypercholesterolemia 02/03/2015  . Hypertension   . Hypothyroidism, unspecified 08/02/2014  . Impaired glucose tolerance 02/07/2017  . Lumbar degenerative disc disease 02/07/2017   Overview:  On 06/24/2015 and 01/08/2017 abdominal CTs  . Osteopenia of left lower leg 02/07/2016  . Thyroid disease     SURGICAL HISTORY: Past Surgical History:  Procedure Laterality Date  . 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  Walks with a cane.  Social History   Socioeconomic History  . Marital status: Widowed    Spouse name: Not on file  . Number of children: Not on file  . Years of education: Not on file  . Highest education level: Not on file  Occupational History  . Not on file  Social Needs  . Financial resource strain: Not on file  . Food insecurity:    Worry: Not on file    Inability: Not on file  . Transportation needs:    Medical: Not on file    Non-medical: Not on file  Tobacco Use  . Smoking status: Never Smoker  . Smokeless tobacco: Current User    Types: Snuff  Substance and Sexual Activity  . Alcohol use: No  . Drug use: No  . Sexual activity: Not on  file  Lifestyle  . Physical activity:    Days per week: Not on file    Minutes per session: Not on file  . Stress: Not on file  Relationships  . Social connections:    Talks on phone: Not on file    Gets together: Not on file    Attends religious service: Not on file    Active member of club or organization: Not on file     Attends meetings of clubs or organizations: Not on file    Relationship status: Not on file  . Intimate partner violence:    Fear of current or ex partner: Not on file    Emotionally abused: Not on file    Physically abused: Not on file    Forced sexual activity: Not on file  Other Topics Concern  . Not on file  Social History Narrative  . Not on file    FAMILY HISTORY: Family History  Problem Relation Age of Onset  . Kidney cancer Neg Hx   . Bladder Cancer Neg Hx     ALLERGIES:  is allergic to penicillin v potassium.  MEDICATIONS:  Current Outpatient Medications  Medication Sig Dispense Refill  . aspirin EC 81 MG tablet Take 1 tablet by mouth daily.    . Cholecalciferol (VITAMIN D) 2000 units tablet Take 1 tablet by mouth daily.    Marland Kitchen docusate sodium (COLACE) 100 MG capsule Take 1 capsule by mouth 2 (two) times daily.    . hydrochlorothiazide (HYDRODIURIL) 25 MG tablet Take 25 mg by mouth daily.     Marland Kitchen KLOR-CON M20 20 MEQ tablet Take 20 mEq by mouth daily.     Marland Kitchen levothyroxine (SYNTHROID, LEVOTHROID) 100 MCG tablet Take 100 mcg by mouth daily before breakfast.     . lisinopril (PRINIVIL,ZESTRIL) 40 MG tablet Take 40 mg by mouth daily.     . magnesium hydroxide (MILK OF MAGNESIA) 400 MG/5ML suspension Take 5 mLs by mouth as needed for constipation.    Marland Kitchen omeprazole (PRILOSEC OTC) 20 MG tablet Take 1 tablet by mouth daily.    . pravastatin (PRAVACHOL) 10 MG tablet Take 10 mg by mouth daily.     . propranolol ER (INDERAL LA) 120 MG 24 hr capsule Take 1 capsule by mouth daily.    Marland Kitchen senna (SENOKOT) 8.6 MG tablet Take 1 tablet by mouth daily.    . vitamin B-12 (CYANOCOBALAMIN) 1000 MCG tablet Take 1 tablet by mouth daily.     No current facility-administered medications for this visit.       Marland Kitchen  PHYSICAL EXAMINATION: ECOG PERFORMANCE STATUS: 2 - Symptomatic, <50% confined to bed  Vitals:   04/15/18 0902  BP: (!) 142/80  Pulse: 80  Resp: 20  Temp: 97.9 F (36.6 C)   Filed  Weights   04/15/18 0902  Weight: 152 lb (68.9 kg)   GENERAL: Well-nourished well-developed; Alert, no distress and comfortable.  Accompanied by family.  Walks with a cane. EYES: no pallor or icterus OROPHARYNX: no thrush or ulceration; NECK: supple; no lymph nodes felt. LYMPH:  no palpable lymphadenopathy in the axillary or inguinal regions LUNGS: Decreased breath sounds auscultation bilaterally. No wheeze or crackles HEART/CVS: regular rate & rhythm and no murmurs; No lower extremity edema ABDOMEN:abdomen soft, non-tender and normal bowel sounds. No hepatomegaly or splenomegaly.  Musculoskeletal:no cyanosis of digits and no clubbing  PSYCH: alert & oriented x 3 with fluent speech NEURO: no focal motor/sensory deficits SKIN:  no rashes or significant lesions  LABORATORY DATA:  I have reviewed the data as listed Lab Results  Component Value Date   WBC 7.8 04/15/2018   HGB 13.3 04/15/2018   HCT 38.2 04/15/2018   MCV 90.9 04/15/2018   PLT 323 04/15/2018   Recent Labs    04/15/18 0939  NA 134*  K 3.8  CL 95*  CO2 27  GLUCOSE 108*  BUN 11  CREATININE 0.84  CALCIUM 9.5  GFRNONAA >60  GFRAA >60  PROT 8.5*  ALBUMIN 3.9  AST 27  ALT 13  ALKPHOS 79  BILITOT 0.8    RADIOGRAPHIC STUDIES: I have personally reviewed the radiological images as listed and agreed with the findings in the report. Ct Chest Wo Contrast  Result Date: 03/31/2018 CLINICAL DATA:  Abnormal chest radiograph. Chronic cough and shortness of breath with exertion. EXAM: CT CHEST WITHOUT CONTRAST TECHNIQUE: Multidetector CT imaging of the chest was performed following the standard protocol without IV contrast. COMPARISON:  None. FINDINGS: Cardiovascular: Atherosclerotic calcification of the arterial vasculature, including coronary arteries. Ascending aorta measures 4.4 cm. Pulmonary arteries and heart are enlarged. No pericardial effusion. Mediastinum/Nodes: Mediastinal lymph nodes measure up to 10 mm in the AP  window. Hilar regions are difficult to evaluate without IV contrast. No axillary adenopathy. Esophagus is grossly unremarkable. Lungs/Pleura: Patchy, possibly masslike, consolidation in the left upper lobe. Extensive bilateral ground-glass nodularity and septal thickening. Mild subpleural peribronchovascular consolidation in both lower lobes. Fissural nodularity. Small partially loculated left pleural effusion. Airway is unremarkable. Upper Abdomen: Visualized portions of the liver, adrenal glands, left kidney, spleen, pancreas, stomach and bowel are grossly unremarkable. No upper abdominal adenopathy. Musculoskeletal: Degenerative changes in the spine. No worrisome lytic or sclerotic lesions. IMPRESSION: 1. Patchy, possibly masslike, consolidation in the left upper lobe with extensive bilateral ground-glass nodularity and septal thickening. Findings are worrisome for malignancy (primary or metastatic) with endobronchial spread/lymphangitic carcinomatosis. 2. Borderline enlarged mediastinal lymph nodes, possibly metastatic. 3. Small loculated left pleural effusion. 4. Aortic atherosclerosis (ICD10-170.0). Coronary artery calcification. 5. Ascending Aortic aneurysm NOS (ICD10-I71.9). Recommend annual imaging followup by CTA or MRA. This recommendation follows 2010 ACCF/AHA/AATS/ACR/ASA/SCA/SCAI/SIR/STS/SVM Guidelines for the Diagnosis and Management of Patients with Thoracic Aortic Disease. Circulation. 2010; 121: Z610-R604. 6. Enlarged pulmonary arteries, indicative of pulmonary arterial hypertension. Electronically Signed   By: Leanna Battles M.D.   On: 03/31/2018 09:21    ASSESSMENT & PLAN:   Mass of upper lobe of left lung #Left upper lobe mass/with significant consolidation of the left upper lobe/question lymphangitic spread; mediastinal adenopathy.  # I had a long discussion with the patient/her daughter regarding the significance concerns for ongoing malignancy; however differential  includes-infection/inflammation Ashley Harmon is very unlikely].  Recommend biopsy for further evaluation.  Recommend a PET scan for biopsy planning.  #Discussed with the daughter that as patient is a non-smoker-high likelihood [30 to 40% chance]-having targetable mutations; to which patient should respond well.  Family/patient not too sure about chemotherapy as an option.  #Falls/question mechanical versus others.  Await above work-up.  #Recommend holding aspirin; in preparation of the biopsy.  I will reach out to Dr. Eliseo Squires referral for bronchoscopy.  Lung nurse navigator, Rolly Salter will reach out to the patient's daughter-with more information.  Plan labs CBC CMP LDH PT PTT CEA today; follow-up based on above work-up.  # I reviewed the blood work- with the patient in detail; also reviewed the imaging independently [as summarized above]; and with the patient in detail.    Thank you Dr.Fleming for allowing me to  participate in the care of your pleasant patient. Please do not hesitate to contact me with questions or concerns in the interim.     All questions were answered. The patient knows to call the clinic with any problems, questions or concerns.       Earna Coder, MD 04/15/2018 11:02 AM

## 2018-04-15 NOTE — Assessment & Plan Note (Addendum)
#  Left upper lobe mass/with significant consolidation of the left upper lobe/question lymphangitic spread; mediastinal adenopathy.  # I had a long discussion with the patient/her daughter regarding the significance concerns for ongoing malignancy; however differential includes-infection/inflammation Royetta Car[which is very unlikely].  Recommend biopsy for further evaluation.  Recommend a PET scan for biopsy planning.  #Discussed with the daughter that as patient is a non-smoker-high likelihood [30 to 40% chance]-having targetable mutations; to which patient should respond well.  Family/patient not too sure about chemotherapy as an option.  #Falls/question mechanical versus others.  Await above work-up.  #Recommend holding aspirin; in preparation of the biopsy.  I will reach out to Dr. Eliseo SquiresFleming-regarding referral for bronchoscopy.  Lung nurse navigator, Rolly SalterHaley will reach out to the patient's daughter-with more information.  Plan labs CBC CMP LDH PT PTT CEA today; follow-up based on above work-up.  # I reviewed the blood work- with the patient in detail; also reviewed the imaging independently [as summarized above]; and with the patient in detail.    Thank you Dr.Fleming for allowing me to participate in the care of your pleasant patient. Please do not hesitate to contact me with questions or concerns in the interim.

## 2018-04-15 NOTE — Patient Instructions (Signed)
STOP asprin- in preparation of possible Lung Biopsy.

## 2018-04-16 ENCOUNTER — Telehealth: Payer: Self-pay | Admitting: *Deleted

## 2018-04-16 ENCOUNTER — Encounter: Payer: Self-pay | Admitting: *Deleted

## 2018-04-16 DIAGNOSIS — R918 Other nonspecific abnormal finding of lung field: Secondary | ICD-10-CM

## 2018-04-16 LAB — CEA: CEA1: 88.3 ng/mL — AB (ref 0.0–4.7)

## 2018-04-16 NOTE — Telephone Encounter (Signed)
Per Trula Orehristina 04/16/18 staff message Pending MD review.  Please cancel and will r/s once approved.  I called Ashley HausenMary E Harmon patient's  (Daughter) And make her aware that the PET scheduled for 04/18/18 had been cancelled as of now And will be R/S once it is approved.

## 2018-04-16 NOTE — Progress Notes (Signed)
  Oncology Nurse Navigator Documentation  Navigator Location: [facility]Med Onc (04/16/18 1200) Referral date to RadOnc/MedOnc: 04/09/18 (04/16/18 1200) )Navigator Encounter Type: Introductory phone call (04/16/18 1200)   Abnormal Finding Date: 03/31/18 (04/16/18 1200)                   Treatment Phase: Abnormal Scans (04/16/18 1200) Barriers/Navigation Needs: Coordination of Care (04/16/18 1200)   Interventions: Coordination of Care (04/16/18 1200)   Coordination of Care: Appts;Broncoscopy;Radiology (04/16/18 1200)        Acuity: Level 2 (04/16/18 1200)   Acuity Level 2: Assistance expediting appointments (04/16/18 1200)  spoke with patient's daughter, Corrie DandyMary, to introduce to navigator services and review upcoming appts. Informed Corrie DandyMary that referral has been entered for pt to see a pulmonologist to evaluate for bronchoscopy. Explained that pt has seen Dr. Meredeth IdeFleming in the past but he does not perform bronchoscopy at this time and would need to see a different pulmonologist for bronchoscopy only then could resume care with Dr. Meredeth IdeFleming. Informed that will be receiving a phone call from pulmonologist office with an appt. Also, informed that will be working on getting PET scan approved and rescheduled but sometimes insurance requires diagnosis of cancer prior to PET being approved and if that is the case then bronchoscopy will occur before the PET. Mary verbalized understanding. Instructed to call if has any further questions or needs. Nothing further needed at this time.    Time Spent with Patient: 30 (04/16/18 1200)

## 2018-04-22 ENCOUNTER — Other Ambulatory Visit
Admission: RE | Admit: 2018-04-22 | Discharge: 2018-04-22 | Disposition: A | Discharge status: Home / Self Care | Payer: Medicare PPO | Source: Ambulatory Visit | Attending: Pulmonary Disease | Admitting: Pulmonary Disease

## 2018-04-22 ENCOUNTER — Ambulatory Visit: Payer: Medicare PPO | Admitting: Pulmonary Disease

## 2018-04-22 ENCOUNTER — Encounter: Payer: Self-pay | Admitting: Pulmonary Disease

## 2018-04-22 VITALS — BP 136/84 | HR 92 | Ht 64.0 in | Wt 150.0 lb

## 2018-04-22 DIAGNOSIS — R059 Cough, unspecified: Secondary | ICD-10-CM

## 2018-04-22 DIAGNOSIS — R05 Cough: Secondary | ICD-10-CM | POA: Diagnosis not present

## 2018-04-22 DIAGNOSIS — R918 Other nonspecific abnormal finding of lung field: Secondary | ICD-10-CM | POA: Insufficient documentation

## 2018-04-22 DIAGNOSIS — R634 Abnormal weight loss: Secondary | ICD-10-CM | POA: Diagnosis not present

## 2018-04-22 DIAGNOSIS — R0609 Other forms of dyspnea: Secondary | ICD-10-CM | POA: Diagnosis not present

## 2018-04-22 NOTE — Progress Notes (Signed)
PULMONARY CONSULT NOTE  Requesting MD/Service: Ashley Harmon Date of initial consultation: 04/22/18 Reason for consultation: abnormal CT chest  PT PROFILE: 84 y.o. female with very minimal remote smoking history referred for evaluation of abnormal CT chest  DATA: 03/31/18 CT chest: Extensive left upper lobe consolidation.  Diffuse bilateral extensive ground glass nodularity throughout both lungs.  Borderline mediastinal lymphadenopathy.  Small loculated left pleural effusion  INTERVAL:  HPI:  She was initially seen at the end of May by primary care physician [name] for cough.  A chest x-ray was performed which was reportedly abnormal.  This led to a CT scan of the chest with results documented above.  She was referred to [name] and then to [name] and subsequently to me for further diagnostic evaluation.  He reports that she has had a cough for "a long time".  It is nonproductive.  She denies hemoptysis and fever.  She does have intermittent chest tightness.  She notes a 50 pound weight loss over the past several years.  She reports moderate exertional dyspnea but is more limited by general frailty.  Denies pleuritic CP, fever, purulent sputum, hemoptysis, LE edema and calf tenderness.   She has never been tested for tuberculosis as far she knows.  The concern of tuberculosis has never been raised in her lifetime.  She has never had any exposures to TB as far she knows.  Past Medical History:  Diagnosis Date  . Atherosclerosis of abdominal aorta (HCC) 02/07/2017   Overview:  On 06/24/2015 and 01/08/2017 abdominal CTs  . Chronic constipation 02/07/2017  . GERD (gastroesophageal reflux disease)   . Hypercholesterolemia 02/03/2015  . Hypertension   . Hypothyroidism, unspecified 08/02/2014  . Impaired glucose tolerance 02/07/2017  . Lumbar degenerative disc disease 02/07/2017   Overview:  On 06/24/2015 and 01/08/2017 abdominal CTs  . Osteopenia of left lower leg 02/07/2016  . Thyroid  disease     Past Surgical History:  Procedure Laterality Date  . ABDOMINAL HYSTERECTOMY      MEDICATIONS: I have reviewed all medications and confirmed regimen as documented  Social History   Socioeconomic History  . Marital status: Widowed    Spouse name: Not on file  . Number of children: Not on file  . Years of education: Not on file  . Highest education level: Not on file  Occupational History  . Not on file  Social Needs  . Financial resource strain: Not on file  . Food insecurity:    Worry: Not on file    Inability: Not on file  . Transportation needs:    Medical: Not on file    Non-medical: Not on file  Tobacco Use  . Smoking status: Never Smoker  . Smokeless tobacco: Current User    Types: Snuff  Substance and Sexual Activity  . Alcohol use: No  . Drug use: No  . Sexual activity: Not on file  Lifestyle  . Physical activity:    Days per week: Not on file    Minutes per session: Not on file  . Stress: Not on file  Relationships  . Social connections:    Talks on phone: Not on file    Gets together: Not on file    Attends religious service: Not on file    Active member of club or organization: Not on file    Attends meetings of clubs or organizations: Not on file    Relationship status: Not on file  . Intimate partner violence:  Fear of current or ex partner: Not on file    Emotionally abused: Not on file    Physically abused: Not on file    Forced sexual activity: Not on file  Other Topics Concern  . Not on file  Social History Narrative  . Not on file    Family History  Problem Relation Age of Onset  . Kidney cancer Neg Hx   . Bladder Cancer Neg Hx     ROS: No fever, myalgias/arthralgias No new focal weakness or sensory deficits No otalgia, hearing loss, visual changes, nasal and sinus symptoms, mouth and throat problems No neck pain or adenopathy No abdominal pain, N/V/D, diarrhea, change in bowel pattern No dysuria, change in urinary  pattern   Vitals:   04/22/18 0813 04/22/18 0825  BP:  136/84  Pulse:  92  SpO2:  98%  Weight: 150 lb (68 kg)   Height: 5\' 4"  (1.626 m)      EXAM:  Gen: Elderly, frail, in wheelchair, pleasant, No overt respiratory distress HEENT: NCAT, sclera white, oropharynx normal, scattered telangiectasias on face Neck: Supple without LAN, thyromegaly, JVD Lungs: breath sounds generally diminished with bibasilar crackles, no wheezes Cardiovascular: RRR, no murmurs noted Abdomen: Soft, nontender, normal BS Ext: Symmetric bilateral ankle and pedal edema without clubbing or cyanosis Neuro: Diffusely weak, CNs grossly intact, no focal deficits Skin: Limited exam, no lesions noted  DATA:   BMP Latest Ref Rng & Units 04/15/2018 02/17/2017 06/24/2015  Glucose 70 - 99 mg/dL 409(W) 119(J) -  BUN 8 - 23 mg/dL 11 11 -  Creatinine 4.78 - 1.00 mg/dL 2.95 6.21 3.08  Sodium 135 - 145 mmol/L 134(L) 132(L) -  Potassium 3.5 - 5.1 mmol/L 3.8 3.6 -  Chloride 98 - 111 mmol/L 95(L) 96(L) -  CO2 22 - 32 mmol/L 27 26 -  Calcium 8.9 - 10.3 mg/dL 9.5 9.4 -    CBC Latest Ref Rng & Units 04/15/2018 02/17/2017 04/15/2013  WBC 3.6 - 11.0 K/uL 7.8 8.4 16.8(H)  Hemoglobin 12.0 - 16.0 g/dL 65.7 84.6 96.2  Hematocrit 35.0 - 47.0 % 38.2 36.2 37.8  Platelets 150 - 440 K/uL 323 237 233    CXR: Her recent chest films are not available for my review.  However, there is a left shoulder film from 10/24/2017 which images the left upper lobe.  The image portion of her left lung reveals diffuse reticulonodular changes  I have personally reviewed all chest radiographs reported above including CXRs and CT chest unless otherwise indicated  IMPRESSION:     ICD-10-CM   1. Pulmonary infiltrate R91.8 QuantiFERON-TB Gold Plus  2. Multiple pulmonary nodules R91.8 QuantiFERON-TB Gold Plus  3. Weight loss R63.4 QuantiFERON-TB Gold Plus  4. Cough R05 QuantiFERON-TB Gold Plus  5. DOE (dyspnea on exertion) R06.09 QuantiFERON-TB Gold Plus    DISC: She has extensive radiographic changes throughout both lungs and most prominently in the left upper lobe.  These changes are not diagnostic but could indeed represent widespread pulmonary metastases.  What ever the process is in her lungs, it is certainly the cause of her cough and dyspnea.  It is also likely the cause of her weight loss.  It appears to be a gradually progressive process as there was evidence of this process in January of this year (on left shoulder film).  Further, a chest film from 2014 did seem to show a vague opacity in the left upper lobe at that time.  However, a CT scan of the abdomen  02/17/2017 revealed mild subpleural reticulations but did not demonstrate the diffuse nodular opacities seen on her current CT scan of the chest.  Overall, the most likely diagnosis would be metastatic cancer.  However, an indolent chronic infection (including tuberculosis) belongs in the differential diagnosis as well.  PLAN:  I reviewed the CT scan in detail with the patient and her daughter.  We discussed the potential diagnoses.  We discussed the option of performing bronchoscopy.  If I were to perform bronchoscopy, I would wash and brush the left upper lobe and take transbronchial biopsies from the right lower lobe.  We discussed the potential risks/cmplications of that procedure.  I think it would have a high likelihood of yielding a definitive diagnosis.  However, I expressed the possibility that knowledge of a definitive diagnosis would not necessarily change the overall outcome as therapeutic options would likely be limited.  With this discussion, the patient and her daughter want to consider whether to proceed with bronchoscopy.  For now, I have ordered a QuantiFERON gold.  We will contact the patient and her daughter regarding the results of that test.  If it is positive, I would favor AFB sputum collection and treatment for a presumptive diagnosis of tuberculosis.  If it is  negative (as it is likely to be), I will discuss again the possibility of bronchoscopy.  However, for now, I think they are leaning away from invasive diagnostic measures.  I did emphasized that this is likely a progressive process.  If we choose to take a nonaggressive approach, we should begin to transition her towards palliative care/hospice.   Billy Fischeravid Morgana Rowley, MD PCCM service Mobile [phone] Pager [phone] 04/22/2018 1:13 PM

## 2018-04-22 NOTE — Patient Instructions (Signed)
TB blood test ordered for today.  You and your daughter are going to discuss the options that we talked about today. In particular, the option of bronchoscopy with lung biopsies.   If the TB blood test is positive, we will proceed with treatment of this as a presumptive diagnosis. If it is negative (which it is likely to be) then we should consider bronchoscopy. It is OK to choose to do nothing but a bronchoscopy is the best (and only) way to make a definite diagnosis  Your daughter is to call me to discuss these options after you have had a chance to discuss them

## 2018-04-26 LAB — QUANTIFERON-TB GOLD PLUS (RQFGPL)
QUANTIFERON NIL VALUE: 0.03 [IU]/mL
QUANTIFERON TB1 AG VALUE: 0.02 [IU]/mL
QuantiFERON Mitogen Value: 2.84 IU/mL
QuantiFERON TB2 Ag Value: 0.02 IU/mL

## 2018-04-26 LAB — QUANTIFERON-TB GOLD PLUS: QUANTIFERON-TB GOLD PLUS: NEGATIVE

## 2018-04-28 ENCOUNTER — Telehealth: Payer: Self-pay | Admitting: *Deleted

## 2018-04-28 NOTE — Telephone Encounter (Signed)
Called pt's daughter and left message. Informed that Dr. Leonard SchwartzB would like to follow up with patient tomorrow [facility] steps. Instructed daughter to call back to schedule pt for follow up. Awaiting call back.

## 2018-04-29 NOTE — Telephone Encounter (Signed)
Pt scheduled on 7/23 at 3:15pm in [place] to discuss palliative care. Pt's daughter is aware of appt.

## 2018-04-29 NOTE — Telephone Encounter (Signed)
Pt's daughter called back and wants to schedule a follow up in the next few weeks. She is going to call back to let me know which day she can bring the pt in for follow up in [place].

## 2018-04-29 NOTE — Telephone Encounter (Signed)
Pt's daughter did no return phone call from yesterday. Do you want to try to get patient scheduled next week for follow up in [place]?

## 2018-05-06 ENCOUNTER — Inpatient Hospital Stay (HOSPITAL_BASED_OUTPATIENT_CLINIC_OR_DEPARTMENT_OTHER): Payer: Medicare PPO | Admitting: Internal Medicine

## 2018-05-06 ENCOUNTER — Other Ambulatory Visit: Payer: Self-pay

## 2018-05-06 VITALS — BP 152/88 | HR 111 | Temp 98.6°F | Resp 18 | Ht 64.0 in | Wt 147.7 lb

## 2018-05-06 DIAGNOSIS — R634 Abnormal weight loss: Secondary | ICD-10-CM

## 2018-05-06 DIAGNOSIS — R918 Other nonspecific abnormal finding of lung field: Secondary | ICD-10-CM | POA: Diagnosis not present

## 2018-05-06 DIAGNOSIS — R0602 Shortness of breath: Secondary | ICD-10-CM

## 2018-05-06 DIAGNOSIS — M8580 Other specified disorders of bone density and structure, unspecified site: Secondary | ICD-10-CM

## 2018-05-06 DIAGNOSIS — K219 Gastro-esophageal reflux disease without esophagitis: Secondary | ICD-10-CM

## 2018-05-06 DIAGNOSIS — M549 Dorsalgia, unspecified: Secondary | ICD-10-CM

## 2018-05-06 DIAGNOSIS — I714 Abdominal aortic aneurysm, without rupture: Secondary | ICD-10-CM

## 2018-05-06 DIAGNOSIS — M2550 Pain in unspecified joint: Secondary | ICD-10-CM

## 2018-05-06 DIAGNOSIS — R05 Cough: Secondary | ICD-10-CM

## 2018-05-06 DIAGNOSIS — E7800 Pure hypercholesterolemia, unspecified: Secondary | ICD-10-CM

## 2018-05-06 DIAGNOSIS — R630 Anorexia: Secondary | ICD-10-CM

## 2018-05-06 DIAGNOSIS — R5383 Other fatigue: Secondary | ICD-10-CM

## 2018-05-06 DIAGNOSIS — J90 Pleural effusion, not elsewhere classified: Secondary | ICD-10-CM

## 2018-05-06 DIAGNOSIS — R599 Enlarged lymph nodes, unspecified: Secondary | ICD-10-CM | POA: Diagnosis not present

## 2018-05-06 DIAGNOSIS — Z7982 Long term (current) use of aspirin: Secondary | ICD-10-CM

## 2018-05-06 DIAGNOSIS — I712 Thoracic aortic aneurysm, without rupture: Secondary | ICD-10-CM

## 2018-05-06 DIAGNOSIS — R531 Weakness: Secondary | ICD-10-CM

## 2018-05-06 DIAGNOSIS — K5909 Other constipation: Secondary | ICD-10-CM

## 2018-05-06 DIAGNOSIS — M5136 Other intervertebral disc degeneration, lumbar region: Secondary | ICD-10-CM

## 2018-05-06 DIAGNOSIS — E039 Hypothyroidism, unspecified: Secondary | ICD-10-CM

## 2018-05-06 DIAGNOSIS — I10 Essential (primary) hypertension: Secondary | ICD-10-CM

## 2018-05-06 DIAGNOSIS — Z79899 Other long term (current) drug therapy: Secondary | ICD-10-CM

## 2018-05-06 NOTE — Progress Notes (Signed)
[place] NOTE  Patient Care Team: Ezequiel Kayser, MD as PCP - General (Internal Medicine) Telford Nab, RN as Registered Nurse  CHIEF COMPLAINTS/PURPOSE OF CONSULTATION:  Left lung mass  #June 2019-left lung mass/mediastinal adenopathy  #   No history exists.     HISTORY OF PRESENTING ILLNESS:  Ashley Harmon 84 y.o.  female non-smoker-is here for follow-up after recent visit with pulmonary for her left lung mass; mediastinal adenopathy.  Patient continues to complain of cough; not getting any better.  Progressive shortness of breath.  Progressive weakness; she continues to have falls because of mechanical reasons.  No loss of consciousness.  Review of Systems  Constitutional: Positive for malaise/fatigue and weight loss. Negative for chills, diaphoresis and fever.  HENT: Negative for nosebleeds and sore throat.   Eyes: Negative for double vision.  Respiratory: Positive for cough, sputum production and shortness of breath. Negative for hemoptysis and wheezing.   Cardiovascular: Negative for chest pain, palpitations, orthopnea and leg swelling.  Gastrointestinal: Negative for abdominal pain, blood in stool, constipation, diarrhea, heartburn, melena, nausea and vomiting.  Genitourinary: Negative for dysuria, frequency and urgency.  Musculoskeletal: Positive for back pain and joint pain.  Skin: Negative.  Negative for itching and rash.  Neurological: Negative for dizziness, tingling, focal weakness, weakness and headaches.  Endo/Heme/Allergies: Does not bruise/bleed easily.  Psychiatric/Behavioral: Negative for depression. The patient is not nervous/anxious and does not have insomnia.      MEDICAL HISTORY:  Past Medical History:  Diagnosis Date  . Atherosclerosis of abdominal aorta ([place]) 02/07/2017   Overview:  On 06/24/2015 and 01/08/2017 abdominal CTs  . Chronic constipation 02/07/2017  . GERD (gastroesophageal reflux disease)   . Hypercholesterolemia  02/03/2015  . Hypertension   . Hypothyroidism, unspecified 08/02/2014  . Impaired glucose tolerance 02/07/2017  . Lumbar degenerative disc disease 02/07/2017   Overview:  On 06/24/2015 and 01/08/2017 abdominal CTs  . Osteopenia of left lower leg 02/07/2016  . Thyroid disease     SURGICAL HISTORY: Past Surgical History:  Procedure Laterality Date  . 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  Walks with a cane.  Social History   Socioeconomic History  . Marital status: Widowed    Spouse name: Not on file  . Number of children: Not on file  . Years of education: Not on file  . Highest education level: Not on file  Occupational History  . Not on file  Social Needs  . Financial resource strain: Not on file  . Food insecurity:    Worry: Not on file    Inability: Not on file  . Transportation needs:    Medical: Not on file    Non-medical: Not on file  Tobacco Use  . Smoking status: Never Smoker  . Smokeless tobacco: Current User    Types: Snuff  Substance and Sexual Activity  . Alcohol use: No  . Drug use: No  . Sexual activity: Not on file  Lifestyle  . Physical activity:    Days per week: Not on file    Minutes per session: Not on file  . Stress: Not on file  Relationships  . Social connections:    Talks on phone: Not on file    Gets together: Not on file    Attends religious service: Not on file    Active member of  club or organization: Not on file    Attends meetings of clubs or organizations: Not on file    Relationship status: Not on file  . Intimate partner violence:    Fear of current or ex partner: Not on file    Emotionally abused: Not on file    Physically abused: Not on file    Forced sexual activity: Not on file  Other Topics Concern  . Not on file  Social History Narrative  . Not on file    FAMILY HISTORY: Family  History  Problem Relation Age of Onset  . Kidney cancer Neg Hx   . Bladder Cancer Neg Hx     ALLERGIES:  is allergic to penicillin v potassium.  MEDICATIONS:  Current Outpatient Medications  Medication Sig Dispense Refill  . aspirin EC 81 MG tablet Take 1 tablet by mouth daily.    . Cholecalciferol (VITAMIN D) 2000 units tablet Take 1 tablet by mouth daily.    Marland Kitchen docusate sodium (COLACE) 100 MG capsule Take 1 capsule by mouth 2 (two) times daily.    . hydrochlorothiazide (HYDRODIURIL) 25 MG tablet Take 25 mg by mouth daily.     Marland Kitchen KLOR-CON M20 20 MEQ tablet Take 20 mEq by mouth daily.     Marland Kitchen levothyroxine (SYNTHROID, LEVOTHROID) 100 MCG tablet Take 100 mcg by mouth daily before breakfast.     . lisinopril (PRINIVIL,ZESTRIL) 40 MG tablet Take 40 mg by mouth daily.     . magnesium hydroxide (MILK OF MAGNESIA) 400 MG/5ML suspension Take 5 mLs by mouth as needed for constipation.    Marland Kitchen omeprazole (PRILOSEC OTC) 20 MG tablet Take 1 tablet by mouth daily.    . pravastatin (PRAVACHOL) 10 MG tablet Take 10 mg by mouth daily.     . propranolol ER (INDERAL LA) 120 MG 24 hr capsule Take 1 capsule by mouth daily.    Marland Kitchen senna (SENOKOT) 8.6 MG tablet Take 1 tablet by mouth daily.    . vitamin B-12 (CYANOCOBALAMIN) 1000 MCG tablet Take 1 tablet by mouth daily.     No current facility-administered medications for this visit.       Marland Kitchen  PHYSICAL EXAMINATION: ECOG PERFORMANCE STATUS: 2 - Symptomatic, <50% confined to bed  Vitals:   05/06/18 1509  BP: (!) 152/88  Pulse: (!) 111  Resp: 18  Temp: 98.6 F (37 C)   Filed Weights   05/06/18 1509  Weight: 147 lb 11.3 oz (67 kg)   GENERAL: Well-nourished well-developed; Alert, no distress and comfortable.  Accompanied by daughter walks with a cane. EYES: no pallor or icterus OROPHARYNX: no thrush or ulceration; NECK: supple; no lymph nodes felt. LYMPH:  no palpable lymphadenopathy in the axillary or inguinal regions LUNGS: Decreased breath sounds  auscultation bilaterally. No wheeze or crackles HEART/CVS: regular rate & rhythm and no murmurs; No lower extremity edema ABDOMEN:abdomen soft, non-tender and normal bowel sounds. No hepatomegaly or splenomegaly.  Musculoskeletal:no cyanosis of digits and no clubbing  PSYCH: alert & oriented x 3 with fluent speech NEURO: no focal motor/sensory deficits SKIN:  no rashes or significant lesions  LABORATORY DATA:  I have reviewed the data as listed Lab Results  Component Value Date   WBC 7.8 04/15/2018   HGB 13.3 04/15/2018   HCT 38.2 04/15/2018   MCV 90.9 04/15/2018   PLT 323 04/15/2018   Recent Labs    04/15/18 0939  NA 134*  K 3.8  CL 95*  CO2 27  GLUCOSE 108*  BUN 11  CREATININE 0.84  CALCIUM 9.5  GFRNONAA >60  GFRAA >60  PROT 8.5*  ALBUMIN 3.9  AST 27  ALT 13  ALKPHOS 79  BILITOT 0.8    RADIOGRAPHIC STUDIES: I have personally reviewed the radiological images as listed and agreed with the findings in the report. No results found.  ASSESSMENT & PLAN:   Mass of upper lobe of left lung #Left upper lobe mass/with significant consolidation of the left upper lobe/question lymphangitic spread; mediastinal adenopathy.  #I again reiterated given on the CT scan-very suggestive of malignancy; less likely infection.  Patient has met with Dr. Alva Garnet from pulmonary-feels that given her age patient at risk for procedural complications.   # Also discussed with the patient/daughter-that only a 40% chance of finding a targetable mutation; which could be treated with pills.  They do understand that if this is malignancy Runell Gess is likely the case]-this cannot be cured.  Patient/daughter not interested in a diagnostic/therapeutic options at this time.  Patient is also waiting to talk to her PCP regarding her decision.   #I discussed regarding palliative care referral; and transition to hospice based on her needs.  Patient and family interested.  Will make a referral today.  #Need to  call tere mcconaughey [daughter]- [phone].  Cc; Dr.Theis.    All questions were answered. The patient knows to call the clinic with any problems, questions or concerns.       Cammie Sickle, MD 05/06/2018 4:00 PM

## 2018-05-06 NOTE — Assessment & Plan Note (Addendum)
#  Left upper lobe mass/with significant consolidation of the left upper lobe/question lymphangitic spread; mediastinal adenopathy.  #I again reiterated given on the CT scan-very suggestive of malignancy; less likely infection.  Patient has met with Dr. Alva Garnet from pulmonary-feels that given her age patient at risk for procedural complications.   # Also discussed with the patient/daughter-that only a 40% chance of finding a targetable mutation; which could be treated with pills.  They do understand that if this is malignancy Runell Gess is likely the case]-this cannot be cured.  Patient/daughter not interested in a diagnostic/therapeutic options at this time.  Patient is also waiting to talk to her PCP regarding her decision.   #I discussed regarding palliative care referral; and transition to hospice based on her needs.  Patient and family interested.  Will make a referral today.  #Need to call erabella kuipers [daughter]- [phone].  Cc; Dr.Theis.

## 2018-05-30 ENCOUNTER — Telehealth: Payer: Self-pay | Admitting: *Deleted

## 2018-05-30 NOTE — Telephone Encounter (Signed)
MD approved hospice. Pending md signature.

## 2018-05-30 NOTE — Telephone Encounter (Signed)
Hospice form faxed.   

## 2018-05-30 NOTE — Telephone Encounter (Signed)
Palliative Care and patient requesting Hospice Referral be sent for this patient Please fax referral if in agreement

## 2018-06-03 ENCOUNTER — Telehealth: Payer: Self-pay | Admitting: *Deleted

## 2018-06-03 NOTE — Telephone Encounter (Signed)
V/o given to hospice nurse for Oxygen 2L nasal canula continuous. Triage nurse at hospice made aware.

## 2018-06-03 NOTE — Telephone Encounter (Signed)
Hospice nurse called asking for a verbal order or the signed comfort orders for patient so that she can have O2 in the home. Please call Hospice Triage nurse [phone]

## 2018-06-27 ENCOUNTER — Telehealth: Payer: Self-pay | Admitting: *Deleted

## 2018-06-27 ENCOUNTER — Other Ambulatory Visit: Payer: Self-pay | Admitting: Internal Medicine

## 2018-06-27 MED ORDER — HYDROCODONE-ACETAMINOPHEN 5-325 MG PO TABS: ORAL_TABLET | ORAL | 0 refills | Status: DC

## 2018-06-27 NOTE — Telephone Encounter (Signed)
Hospice requests Prescription for acetaminophen/hydrocodone 5/325 1/2-1 every 6 hours as needed for pain.  Medication was previously prescribed by PCP.

## 2018-06-30 NOTE — Telephone Encounter (Signed)
Script sent to pharmacy on 9/13

## 2018-08-01 ENCOUNTER — Telehealth: Payer: Self-pay | Admitting: *Deleted

## 2018-08-01 NOTE — Telephone Encounter (Signed)
Call returned to [facility] and she will have patient get appointment with eye doctor Explained all documentation of conversations with patient and daughter regarding her cancer

## 2018-08-01 NOTE — Telephone Encounter (Signed)
I'd like for her to be evaluated by her eye doctor for this.

## 2018-08-01 NOTE — Telephone Encounter (Signed)
Patient has swollen left eye, draining a white liquid and patient reports having a film over her vision in left eye. Please advise  Patient also states she does not know much about her cancer and would like to know more about what is going on with it

## 2018-08-14 ENCOUNTER — Telehealth: Payer: Self-pay | Admitting: *Deleted

## 2018-08-14 ENCOUNTER — Other Ambulatory Visit: Payer: Self-pay | Admitting: *Deleted

## 2018-08-14 MED ORDER — HYDROCODONE-ACETAMINOPHEN 5-325 MG PO TABS: ORAL_TABLET | ORAL | 0 refills | Status: DC

## 2018-08-14 NOTE — Telephone Encounter (Signed)
I called hospice and asked that Verlon Au return call to Burna Mortimer, NP and gave her office number

## 2018-08-14 NOTE — Telephone Encounter (Signed)
Please have nurse who is caring for this patient to contact me so we can discuss further. Thanks.

## 2018-08-14 NOTE — Telephone Encounter (Signed)
Requesting prescription for Flomax to help with dysuria. Please order if agree.

## 2018-08-14 NOTE — Telephone Encounter (Signed)
Patient having difficulty voiding, slow stream asking for order for FLomax. Please advise

## 2018-08-21 ENCOUNTER — Telehealth: Payer: Self-pay | Admitting: *Deleted

## 2018-08-21 NOTE — Telephone Encounter (Signed)
Patient having difficulty urinating , and also reports tremors. Needs refill of her Norco. Appointment accepted for tomorrow

## 2018-08-22 ENCOUNTER — Inpatient Hospital Stay: Attending: Nurse Practitioner | Admitting: Nurse Practitioner

## 2018-08-22 ENCOUNTER — Other Ambulatory Visit: Payer: Self-pay

## 2018-08-22 ENCOUNTER — Encounter: Payer: Self-pay | Admitting: Nurse Practitioner

## 2018-08-22 VITALS — BP 134/85 | HR 106 | Temp 96.8°F | Resp 18

## 2018-08-22 DIAGNOSIS — R339 Retention of urine, unspecified: Secondary | ICD-10-CM

## 2018-08-22 DIAGNOSIS — I10 Essential (primary) hypertension: Secondary | ICD-10-CM | POA: Diagnosis not present

## 2018-08-22 DIAGNOSIS — K5909 Other constipation: Secondary | ICD-10-CM

## 2018-08-22 DIAGNOSIS — R918 Other nonspecific abnormal finding of lung field: Secondary | ICD-10-CM

## 2018-08-22 DIAGNOSIS — K5900 Constipation, unspecified: Secondary | ICD-10-CM

## 2018-08-22 DIAGNOSIS — R590 Localized enlarged lymph nodes: Secondary | ICD-10-CM

## 2018-08-22 DIAGNOSIS — Z79899 Other long term (current) drug therapy: Secondary | ICD-10-CM | POA: Diagnosis not present

## 2018-08-22 DIAGNOSIS — Z7982 Long term (current) use of aspirin: Secondary | ICD-10-CM | POA: Diagnosis not present

## 2018-08-22 DIAGNOSIS — Z960 Presence of urogenital implants: Secondary | ICD-10-CM

## 2018-08-22 DIAGNOSIS — Z515 Encounter for palliative care: Secondary | ICD-10-CM

## 2018-08-22 DIAGNOSIS — R251 Tremor, unspecified: Secondary | ICD-10-CM | POA: Diagnosis not present

## 2018-08-22 DIAGNOSIS — Z978 Presence of other specified devices: Secondary | ICD-10-CM

## 2018-08-22 DIAGNOSIS — L89322 Pressure ulcer of left buttock, stage 2: Secondary | ICD-10-CM | POA: Insufficient documentation

## 2018-08-22 LAB — URINALYSIS, COMPLETE (UACMP) WITH MICROSCOPIC
BILIRUBIN URINE: NEGATIVE
Bacteria, UA: NONE SEEN
GLUCOSE, UA: NEGATIVE mg/dL
Hgb urine dipstick: NEGATIVE
Ketones, ur: NEGATIVE mg/dL
LEUKOCYTES UA: NEGATIVE
NITRITE: NEGATIVE
PH: 7 (ref 5.0–8.0)
Protein, ur: NEGATIVE mg/dL
Specific Gravity, Urine: 1.011 (ref 1.005–1.030)
Squamous Epithelial / LPF: NONE SEEN (ref 0–5)

## 2018-08-22 NOTE — Progress Notes (Signed)
Symptom Management [place]  Telephone:([phone] [phone]  Patient Care Team: Ezequiel Kayser, MD as PCP - General (Internal Medicine) Telford Nab, RN as Registered Nurse   Name of the patient: Ashley Harmon  [id number]  [birth year]   Date of visit: 08/22/18  Diagnosis- Left Lung Mass  Chief complaint/ Reason for visit- Urinary retention, Constipation, Tremors  Heme/Onc history:  Ashley Harmon, 84 year old female, who initially presented with left lung mass, mediastinal adenopathy.  She met with Dr. Rogue Bussing to discuss results of imaging which were highly suggestive of malignancy.  She met with pulmonary, Dr. Alva Garnet, who felt she was at risk for procedural complications.  Patient and daughter were not interested in diagnostic or therapeutic options and ultimately opted to proceed with hospice care.  She has been under hospice care since August 2019.  Interval history- Ashley Harmon, 84 year old female, with above history of left lung mass suspicious for malignancy who presents to symptom management clinic for urinary retention, constipation, and tremors.  She states that over the past couple of weeks she has noticed feeling of incomplete voiding.  Hospice nurse contacted clinic requesting prescription for Flomax.  She denies pain or burning when she urinates.  Nothing seems to make symptoms better or worse.  She has no history of kidney stones or prior history of retention.  She states that recently she has had ongoing issues with constipation and has had her medications adjusted through hospice and underwent a manual disimpaction last week which caused irritation, pain, and skin breakdown on her bottom.  She continues to have problems with constipation.  She is unsure what medication she is taking. She also states that she has had ongoing tremors and has tried multiple medications for these previously without improvement in her symptoms and so she self  stopped.  She says that she is curious what her cancer is doing but is not interested in pursuing any treatment or diagnostics.  Prior to acute symptoms she has felt well and continues to live alone with daughter living next door assisting with care.  ECOG FS:3 - Symptomatic, >50% confined to bed  Review of systems- Review of Systems  Constitutional: Positive for malaise/fatigue and weight loss. Negative for chills and fever.  HENT: Negative.   Eyes: Negative.   Respiratory: Positive for shortness of breath. Negative for cough and sputum production.   Cardiovascular: Negative for chest pain, palpitations, orthopnea, claudication and leg swelling.  Gastrointestinal: Positive for constipation. Negative for abdominal pain, blood in stool, diarrhea, heartburn, melena, nausea and vomiting.  Genitourinary: Positive for frequency.       Retention  Musculoskeletal: Negative.   Skin:       Skin breakdown on bottom  Neurological: Positive for tremors and weakness.  Endo/Heme/Allergies: Bruises/bleeds easily.  Psychiatric/Behavioral: Negative.      Current treatment- Hospice  Allergies  Allergen Reactions  . Penicillin V Potassium     Other reaction(s): Unknown    Past Medical History:  Diagnosis Date  . Atherosclerosis of abdominal aorta ([place]) 02/07/2017   Overview:  On 06/24/2015 and 01/08/2017 abdominal CTs  . Chronic constipation 02/07/2017  . GERD (gastroesophageal reflux disease)   . Hypercholesterolemia 02/03/2015  . Hypertension   . Hypothyroidism, unspecified 08/02/2014  . Impaired glucose tolerance 02/07/2017  . Lumbar degenerative disc disease 02/07/2017   Overview:  On 06/24/2015 and 01/08/2017 abdominal CTs  . Osteopenia of left lower leg 02/07/2016  . Thyroid disease  Past Surgical History:  Procedure Laterality Date  . ABDOMINAL HYSTERECTOMY      Social History   Socioeconomic History  . Marital status: Widowed    Spouse name: Not on file  . Number of children: Not  on file  . Years of education: Not on file  . Highest education level: Not on file  Occupational History  . Not on file  Social Needs  . Financial resource strain: Not on file  . Food insecurity:    Worry: Not on file    Inability: Not on file  . Transportation needs:    Medical: Not on file    Non-medical: Not on file  Tobacco Use  . Smoking status: Never Smoker  . Smokeless tobacco: Current User    Types: Snuff  Substance and Sexual Activity  . Alcohol use: No  . Drug use: No  . Sexual activity: Not on file  Lifestyle  . Physical activity:    Days per week: Not on file    Minutes per session: Not on file  . Stress: Not on file  Relationships  . Social connections:    Talks on phone: Not on file    Gets together: Not on file    Attends religious service: Not on file    Active member of club or organization: Not on file    Attends meetings of clubs or organizations: Not on file    Relationship status: Not on file  . Intimate partner violence:    Fear of current or ex partner: Not on file    Emotionally abused: Not on file    Physically abused: Not on file    Forced sexual activity: Not on file  Other Topics Concern  . Not on file  Social History Narrative  . Not on file    Family History  Problem Relation Age of Onset  . Kidney cancer Neg Hx   . Bladder Cancer Neg Hx      Current Outpatient Medications:  .  aspirin EC 81 MG tablet, Take 1 tablet by mouth daily., Disp: , Rfl:  .  Cholecalciferol (VITAMIN D) 2000 units tablet, Take 1 tablet by mouth daily., Disp: , Rfl:  .  docusate sodium (COLACE) 100 MG capsule, Take 2 capsules by mouth 2 (two) times daily. , Disp: , Rfl:  .  hydrochlorothiazide (HYDRODIURIL) 25 MG tablet, Take 25 mg by mouth daily. , Disp: , Rfl:  .  HYDROcodone-acetaminophen (NORCO/VICODIN) 5-325 MG tablet, 1/2 to 1 pill every 6 hours as needed for pain, Disp: 45 tablet, Rfl: 0 .  KLOR-CON M20 20 MEQ tablet, Take 20 mEq by mouth daily. ,  Disp: , Rfl:  .  levothyroxine (SYNTHROID, LEVOTHROID) 100 MCG tablet, Take 100 mcg by mouth daily before breakfast. , Disp: , Rfl:  .  lisinopril (PRINIVIL,ZESTRIL) 40 MG tablet, Take 40 mg by mouth daily. , Disp: , Rfl:  .  magnesium hydroxide (MILK OF MAGNESIA) 400 MG/5ML suspension, Take 5 mLs by mouth as needed for constipation., Disp: , Rfl:  .  omeprazole (PRILOSEC OTC) 20 MG tablet, Take 1 tablet by mouth daily., Disp: , Rfl:  .  pravastatin (PRAVACHOL) 10 MG tablet, Take 10 mg by mouth daily. , Disp: , Rfl:  .  propranolol ER (INDERAL LA) 120 MG 24 hr capsule, Take 1 capsule by mouth daily., Disp: , Rfl:  .  senna (SENOKOT) 8.6 MG tablet, Take 1 tablet by mouth daily., Disp: , Rfl:  .  vitamin  B-12 (CYANOCOBALAMIN) 1000 MCG tablet, Take 1 tablet by mouth daily., Disp: , Rfl:   Physical exam:  Vitals:   08/22/18 1120  BP: 134/85  Pulse: (!) 106  Resp: 18  Temp: (!) 96.8 F (36 C)  TempSrc: Tympanic   Physical Exam  Constitutional:  Elderly, thinly built female. NAD. In wheelchair. Accompanied by daughter.   HENT:  Head: Normocephalic.  Eyes: Conjunctivae are normal. No scleral icterus.  Cardiovascular: Normal rate and regular rhythm.  Pulmonary/Chest: Effort normal. No respiratory distress.  Abdominal: Soft. Bowel sounds are normal. She exhibits no distension. There is tenderness (pelvic tenderness w/ palpation). There is no rebound.  Genitourinary:     Neurological: She is alert.  Skin: Skin is warm and dry. There is pallor.  Stage 2 pressure ulcer on left buttock  Psychiatric: She has a normal mood and affect. Her behavior is normal.     CMP Latest Ref Rng & Units 04/15/2018  Glucose 70 - 99 mg/dL 108(H)  BUN 8 - 23 mg/dL 11  Creatinine 0.44 - 1.00 mg/dL 0.84  Sodium 135 - 145 mmol/L 134(L)  Potassium 3.5 - 5.1 mmol/L 3.8  Chloride 98 - 111 mmol/L 95(L)  CO2 22 - 32 mmol/L 27  Calcium 8.9 - 10.3 mg/dL 9.5  Total Protein 6.5 - 8.1 g/dL 8.5(H)  Total Bilirubin  0.3 - 1.2 mg/dL 0.8  Alkaline Phos 38 - 126 U/L 79  AST 15 - 41 U/L 27  ALT 0 - 44 U/L 13   CBC Latest Ref Rng & Units 04/15/2018  WBC 3.6 - 11.0 K/uL 7.8  Hemoglobin 12.0 - 16.0 g/dL 13.3  Hematocrit 35.0 - 47.0 % 38.2  Platelets 150 - 440 K/uL 323    No images are attached to the encounter.  No results found.  Assessment and plan- Patient is a 84 y.o. female diagnosed with lung mass suspicious of malignancy who presents to [facility] for urinary retention, constipation, and tremor.   1. Lung Mass-lung mass of the upper left lobe with significant consolidation of the left upper lobe of lung with questionable lymphangitic spread and mediastinal adenopathy.  Findings very suggestive of malignancy.  Met with pulmonary who felt she was not a procedural candidate.  Patient elected for hospice and continues hospice care at this time.  2.  Urinary retention-etiology unclear but since questioning medication related. Discussed options for management today including risks versus benefits of in and out catheterization, urology evaluation of cause and/or work-up, or  placement of Foley catheter.  She elected to proceed with placement of Foley catheter.  Risks of infection given indwelling device discussed in detail and all patient questions answered.  Advised patient that she could do short course with Foley catheter and consider in and out catheterization for urological evaluation in the future. 16 Fr foley (10cc balloon) catheter placed in clinic today with return of 1400cc of clear yellow urine. Baseline UA negative for infection.  Patient provided with leg bag for comfort. Foley care demonstrated and discussed with family.  Discussed placement of Foley with hospice nurse Magda Paganini.  She will coordinate further foley care and management through hospice.   3. Constipation- chronic- likely r/t overall decline and medication induced. Discussed with hospice nurse who states that patient is currently receiving senna 2  tablets 3 times a day and Colace 3 times a day with milk of magnesia for constipation as needed.Discussed with up titrating to 4 times a day and considering adding lactulose, suppositories, or sorbitol.  Per  patient preference would avoid manual disimpaction if possible.  Could also consider Linzess and/or mag citrate.  Advised patient and daughter the goal would be to have 1 bowel movement daily or every other day without pain or straining.  Patient is reporting pain around the anus and has been applying zinc oxide.  Could also consider Preparation H or Tucks pads with lidocaine for comfort.  4.  Pressure ulcer-stage II pressure ulcer of the left buttock likely due to prolonged sitting, fragile skin, overall decline.  Encouraged skin protocols per hospice.  [place] wishes to continue hospice care.  Discussed follow up care with hospice nurse today.  Follow up with hospice. Per patient request, no future appointments made today but she is welcome to call for return appointment  As needed.   Visit Diagnosis 1. Mass of upper lobe of left lung   2. Hospice care patient   3. Urinary retention   4. Constipation, unspecified constipation type   5. Pressure injury of left buttock, stage 2 ([place])   6. Foley catheter in place     Patient expressed understanding and was in agreement with this plan. She also understands that She can call clinic at any time with any questions, concerns, or complaints.   Thank you for allowing me to participate in the care of this very pleasant patient.   Beckey Rutter, DNP, AGNP-C [facility] [place] (work cell) [phone] (office)  CC: Dr. Rogue Bussing

## 2018-08-26 ENCOUNTER — Encounter: Payer: Self-pay | Admitting: Nurse Practitioner

## 2018-08-27 ENCOUNTER — Other Ambulatory Visit: Payer: Self-pay | Admitting: *Deleted

## 2018-08-27 MED ORDER — HYDROCODONE-ACETAMINOPHEN 5-325 MG PO TABS: ORAL_TABLET | ORAL | 0 refills | Status: AC

## 2018-08-27 NOTE — Telephone Encounter (Signed)
Patient also told hospice nurse that we were to order a lidocaine cream for her sacral wound. Please advise

## 2018-08-27 NOTE — Addendum Note (Signed)
Addended by: Valinda HoarELLINGTON, Deondria Puryear C on: 08/27/2018 11:01 AM   Modules accepted: Orders

## 2018-08-29 ENCOUNTER — Telehealth: Payer: Self-pay | Admitting: *Deleted

## 2018-08-29 ENCOUNTER — Telehealth: Payer: Self-pay | Admitting: Internal Medicine

## 2018-08-29 MED ORDER — SULFAMETHOXAZOLE-TRIMETHOPRIM 800-160 MG PO TABS: 1.0000 | ORAL_TABLET | Freq: Two times a day (BID) | ORAL | 1 refills | Status: AC

## 2018-08-29 NOTE — Telephone Encounter (Signed)
See md's separate phone note:

## 2018-08-29 NOTE — Telephone Encounter (Signed)
UTI- culture pending; start/sent bactrim ds BID x 5 days. Dr.B

## 2018-08-29 NOTE — Telephone Encounter (Signed)
Ashley Harmon informd of Bactrim prescription sent in

## 2018-08-29 NOTE — Telephone Encounter (Signed)
Hospice nurse asking if physician received Urine report and if he wants to order antibiotics. Please advise

## 2018-09-23 ENCOUNTER — Encounter: Payer: Self-pay | Admitting: Internal Medicine

## 2018-10-16 ENCOUNTER — Telehealth: Payer: Self-pay | Admitting: *Deleted

## 2018-10-16 NOTE — Telephone Encounter (Signed)
Vanessa with Hospice called to update on patient condition, she is declining, not getting out of bed due to leg weakness. She is not taking Norco, so nurse has started her on Roxanol. Due to difficulty swallowing.

## 2018-11-15 DEATH — deceased

## 2019-04-27 IMAGING — CT CT CHEST W/O CM
1 series · 15 of 34 positions shown, 19 images · non-contrast
Comparison: None.

CLINICAL DATA: Abnormal chest radiograph. Chronic cough and
shortness of breath with exertion.

EXAM:
CT CHEST WITHOUT CONTRAST
TECHNIQUE: Multidetector CT imaging of the chest was performed following the
standard protocol without IV contrast.

[Series 2: thorax · axial · 0.73mm/px · z∈[-258,+2]mm · 15 of 154 slices shown, 19 images]
[im 12/154  mediastinal]
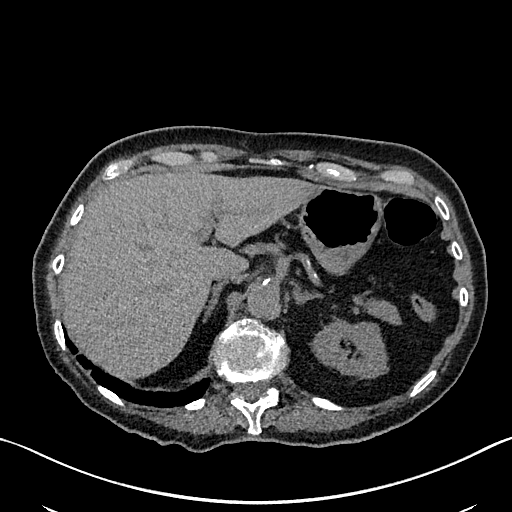
[im 12/154  lung]
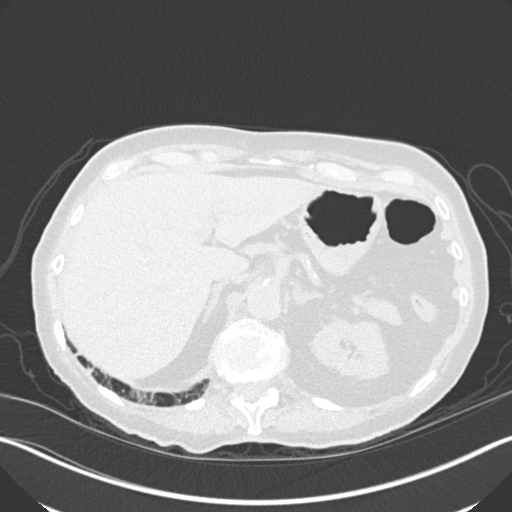
[im 23/154  lung]
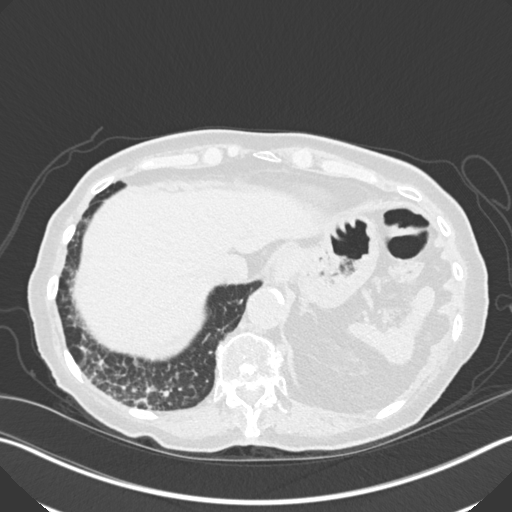
[im 31/154  lung]
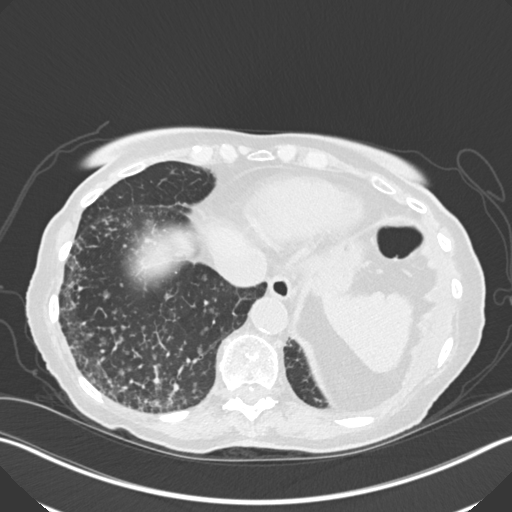
[im 40/154  lung]
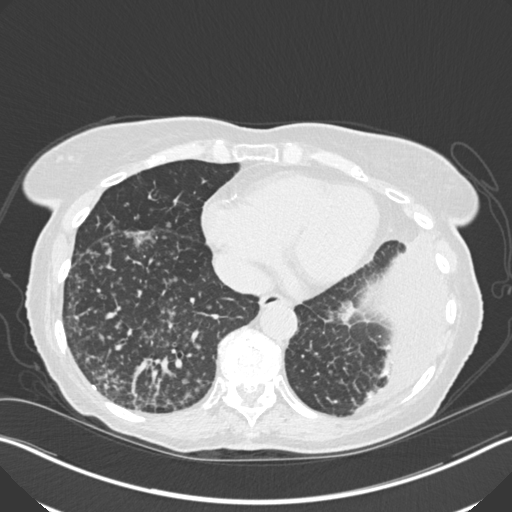
[im 52/154  mediastinal]
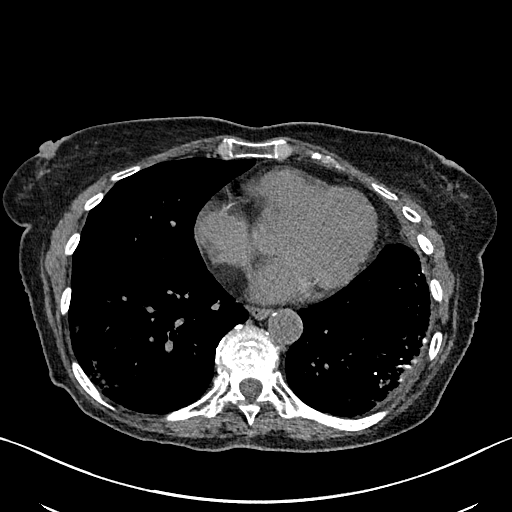
[im 52/154  lung]
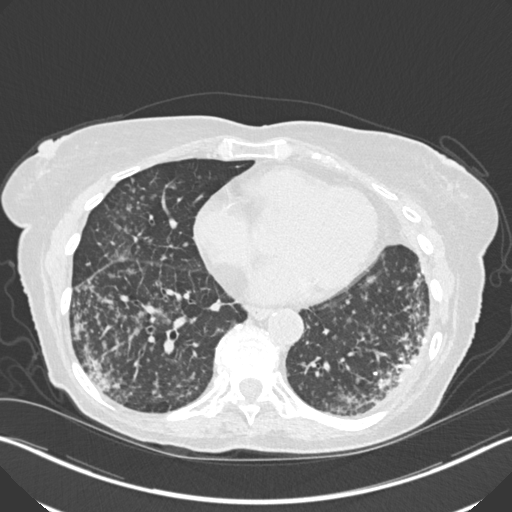
[im 62/154  lung]
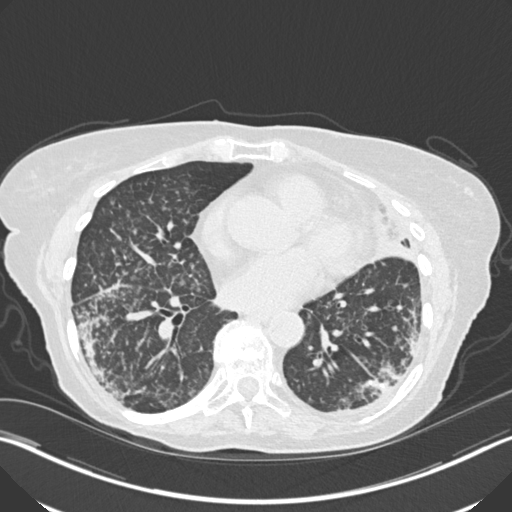
[im 69/154  lung]
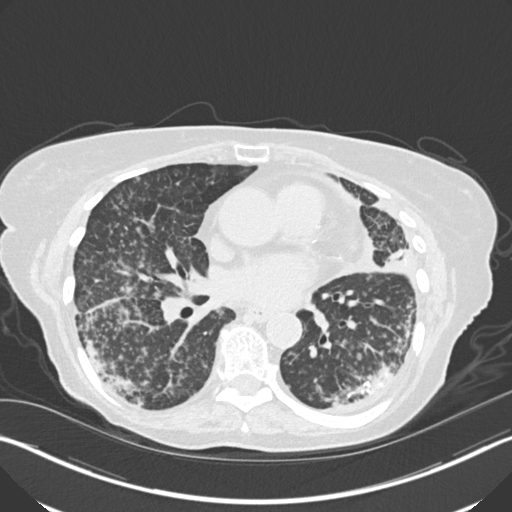
[im 80/154  lung]
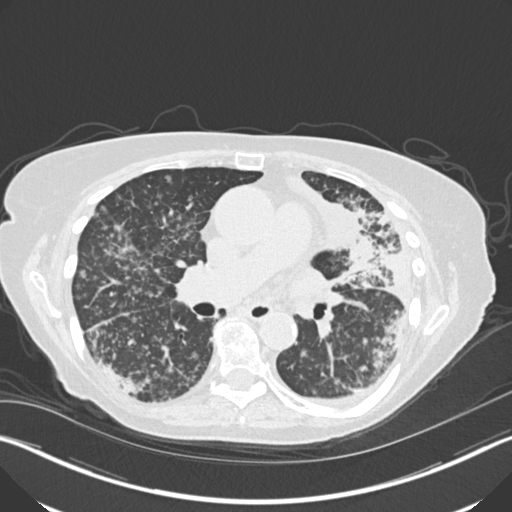
[im 86/154  mediastinal]
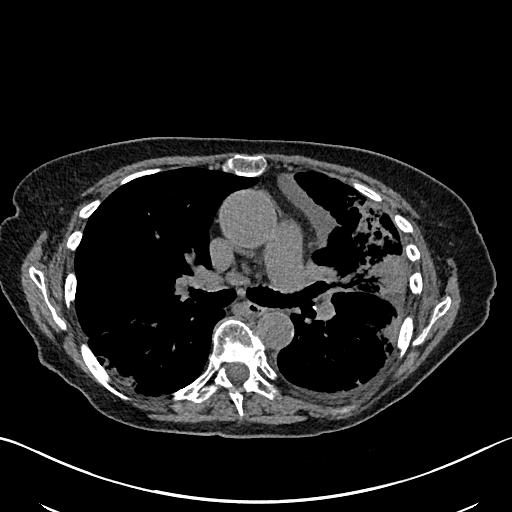
[im 86/154  lung]
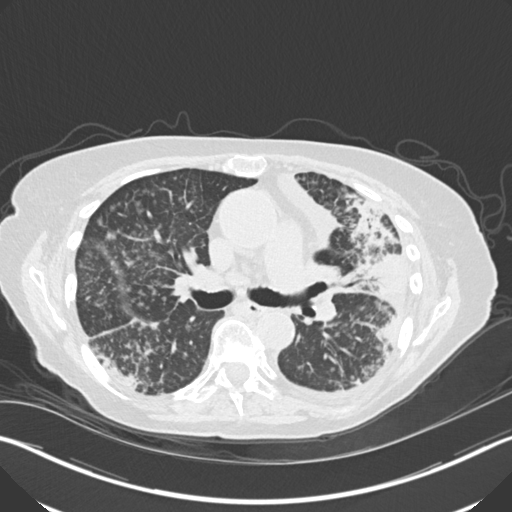
[im 92/154  lung]
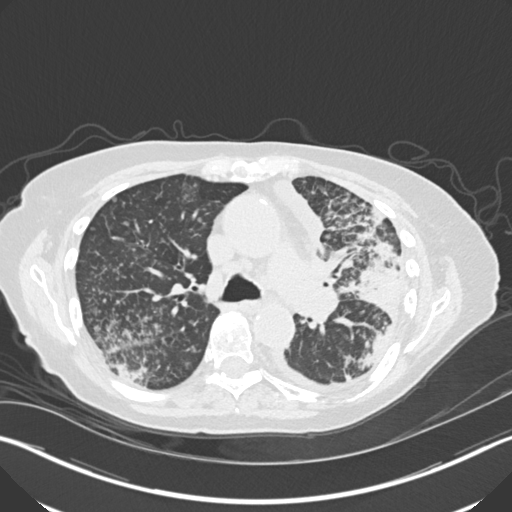
[im 103/154  lung]
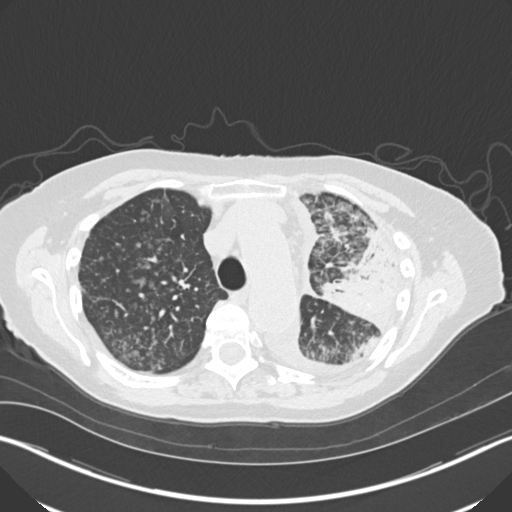
[im 114/154  lung]
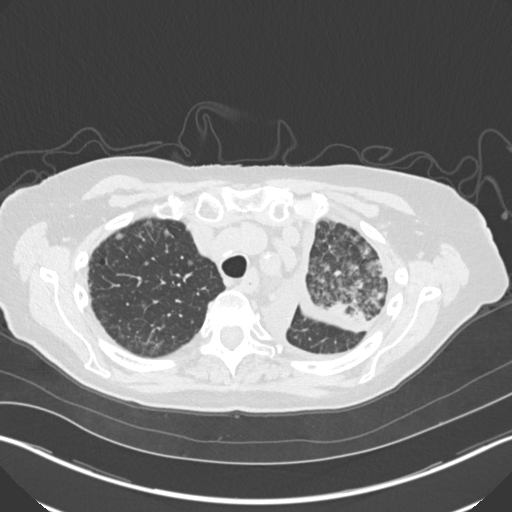
[im 123/154  mediastinal]
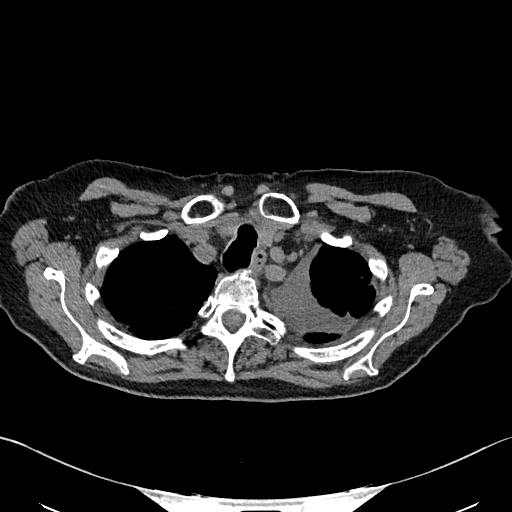
[im 123/154  lung]
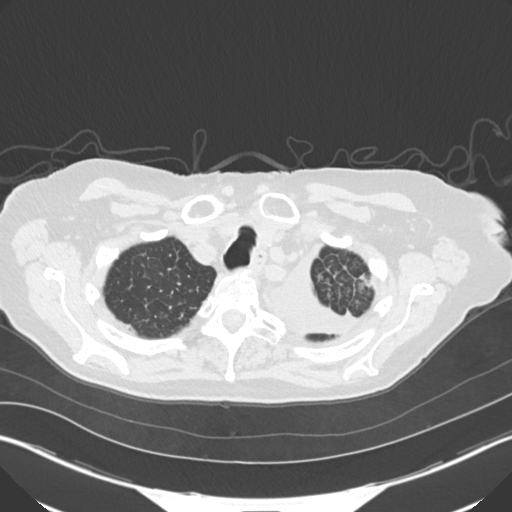
[im 131/154  lung]
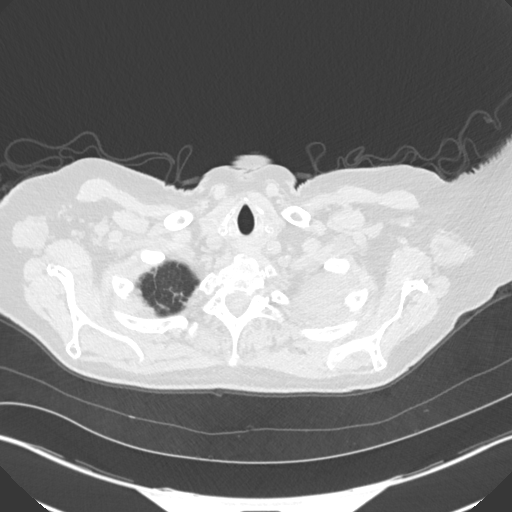
[im 142/154  lung]
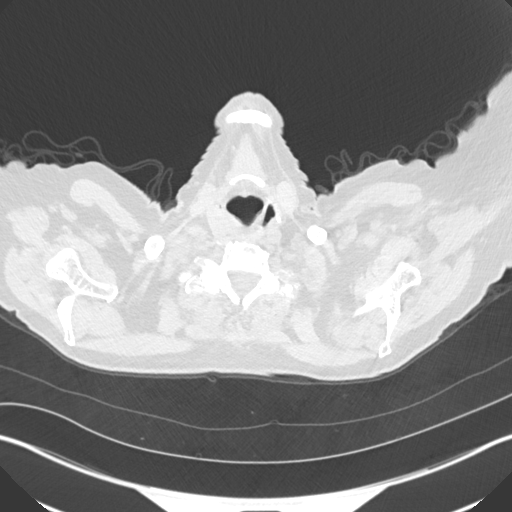

[15 of 34 positions shown; findings below may reference images not displayed]

FINDINGS: Cardiovascular: Atherosclerotic calcification of the arterial
vasculature, including coronary arteries. Ascending aorta measures
4.4 cm. Pulmonary arteries and heart are enlarged. No pericardial
effusion.

Mediastinum/Nodes: Mediastinal lymph nodes measure up to 10 mm in
the AP window. Hilar regions are difficult to evaluate without IV
contrast. No axillary adenopathy. Esophagus is grossly unremarkable.

Lungs/Pleura: Patchy, possibly masslike, consolidation in the left
upper lobe. Extensive bilateral ground-glass nodularity and septal
thickening. Mild subpleural peribronchovascular consolidation in
both lower lobes. Fissural nodularity. Small partially loculated
left pleural effusion. Airway is unremarkable.

Upper Abdomen: Visualized portions of the liver, adrenal glands,
left kidney, spleen, pancreas, stomach and bowel are grossly
unremarkable. No upper abdominal adenopathy.

Musculoskeletal: Degenerative changes in the spine. No worrisome
lytic or sclerotic lesions.
IMPRESSION: 1. Patchy, possibly masslike, consolidation in the left upper lobe
with extensive bilateral ground-glass nodularity and septal
thickening. Findings are worrisome for malignancy (primary or
metastatic) with endobronchial spread/lymphangitic carcinomatosis.
2. Borderline enlarged mediastinal lymph nodes, possibly metastatic.
3. Small loculated left pleural effusion.
4. Aortic atherosclerosis (0XO67-170.0). Coronary artery
calcification.
5. Ascending Aortic aneurysm NOS ([id number]-Q70.A). Recommend annual
imaging followup by CTA or MRA. This recommendation follows 8868
ACCF/AHA/AATS/ACR/ASA/SCA/LILLHANNUS/SUZUKI/TEJU/SHINGIRAI Guidelines for the
Diagnosis and Management of Patients with Thoracic Aortic Disease.
Circulation. 8868; 121: [id number].
6. Enlarged pulmonary arteries, indicative of pulmonary arterial
hypertension.
